# Patient Record
Sex: Female | Born: 1960 | Race: White | Hispanic: No | Marital: Married | Smoking: Never smoker
Health system: Southern US, Community
[De-identification: ages and names within clinical notes are randomized; demographics above are authoritative.]

## PROBLEM LIST (undated history)

## (undated) DIAGNOSIS — G43909 Migraine, unspecified, not intractable, without status migrainosus: Secondary | ICD-10-CM

## (undated) HISTORY — PX: TONSILLECTOMY: SHX5217

## (undated) HISTORY — PX: FOOT SURGERY: SHX648

## (undated) HISTORY — DX: Migraine, unspecified, not intractable, without status migrainosus: G43.909

---

## 1988-09-27 HISTORY — PX: CHOLECYSTECTOMY: SHX55

## 2010-09-10 ENCOUNTER — Ambulatory Visit: Payer: Self-pay | Admitting: Family Medicine

## 2010-10-01 ENCOUNTER — Ambulatory Visit: Payer: Self-pay | Admitting: Family Medicine

## 2011-05-29 LAB — HM COLONOSCOPY

## 2012-05-28 HISTORY — PX: COLONOSCOPY: SHX5424

## 2012-08-21 LAB — HM PAP SMEAR: HM Pap smear: NORMAL

## 2012-10-05 ENCOUNTER — Ambulatory Visit (INDEPENDENT_AMBULATORY_CARE_PROVIDER_SITE_OTHER): Payer: BC Managed Care – PPO | Admitting: Otolaryngology

## 2012-10-05 ENCOUNTER — Other Ambulatory Visit (HOSPITAL_COMMUNITY)
Admission: RE | Admit: 2012-10-05 | Discharge: 2012-10-05 | Disposition: A | Discharge status: Home / Self Care | Payer: BC Managed Care – PPO | Source: Ambulatory Visit | Attending: Otolaryngology | Admitting: Otolaryngology

## 2012-10-05 DIAGNOSIS — D3701 Neoplasm of uncertain behavior of lip: Secondary | ICD-10-CM

## 2012-10-05 DIAGNOSIS — K099 Cyst of oral region, unspecified: Secondary | ICD-10-CM | POA: Insufficient documentation

## 2015-07-16 ENCOUNTER — Ambulatory Visit (INDEPENDENT_AMBULATORY_CARE_PROVIDER_SITE_OTHER): Payer: BLUE CROSS/BLUE SHIELD | Admitting: Family Medicine

## 2015-07-16 ENCOUNTER — Encounter: Payer: Self-pay | Admitting: Family Medicine

## 2015-07-16 VITALS — BP 100/58 | HR 91 | Temp 98.6°F | Resp 14 | Ht 68.0 in | Wt 132.2 lb

## 2015-07-16 DIAGNOSIS — G43909 Migraine, unspecified, not intractable, without status migrainosus: Secondary | ICD-10-CM | POA: Insufficient documentation

## 2015-07-16 DIAGNOSIS — G43709 Chronic migraine without aura, not intractable, without status migrainosus: Secondary | ICD-10-CM

## 2015-07-16 MED ORDER — ZOLMITRIPTAN 5 MG PO TABS: 5.0000 mg | ORAL_TABLET | ORAL | Status: DC | PRN

## 2015-07-16 NOTE — Progress Notes (Signed)
Name: Diana Stevens   MRN: [id number]    DOB: 1960-11-29   Date:07/16/2015       Progress Note  Subjective  Chief Complaint  Chief Complaint  Patient presents with  . Medication Refill    patient needs a refill of this medicaiton (Zolmitriptan 5mg ) and wants to know if she could get refills.    Migraine  This is a chronic problem. The problem has been unchanged. The pain is located in the frontal region. The pain radiates to the face. The quality of the pain is described as throbbing. The pain is at a severity of 7/10 (can be 10/10 when the headache comes on.). Pertinent negatives include no blurred vision, ear pain, nausea, photophobia, sinus pressure, tinnitus or vomiting. The symptoms are aggravated by emotional stress. She has tried triptans for the symptoms. Her past medical history is significant for migraine headaches.   Past Medical History  Diagnosis Date  . Migraines     Past Surgical History  Procedure Laterality Date  . Cholecystectomy  1990  . Foot surgery Left     twice  . Tonsillectomy    . Colonoscopy  05/28/2012    normal, repeat in 10 yrs    Family History  Problem Relation Age of Onset  . Adopted: Yes    Social History   Social History  . Marital Status: Married    Spouse Name: N/A  . Number of Children: N/A  . Years of Education: N/A   Occupational History  . Not on file.   Social History Main Topics  . Smoking status: Former Research scientist (life sciences)  . Smokeless tobacco: Not on file     Comment: patient was a social smoker, very light use (1pk a month)  . Alcohol Use: 0.0 oz/week    0 Standard drinks or equivalent per week     Comment: occasional  . Drug Use: No  . Sexual Activity:    Partners: Male   Other Topics Concern  . Not on file   Social History Narrative  . No narrative on file     Current outpatient prescriptions:  .  CALCIUM CARBONATE-VIT D-MIN PO, Take by mouth., Disp: , Rfl:  .  diphenhydrAMINE (SOMINEX) 25 MG tablet, Take 25 mg by mouth  at bedtime as needed for sleep., Disp: , Rfl:  .  Multiple Vitamins-Minerals (MULTIPLE VITAMINS/WOMENS PO), Take by mouth., Disp: , Rfl:  .  pyridOXINE (VITAMIN B-6) 25 MG tablet, Take by mouth., Disp: , Rfl:  .  zolmitriptan (ZOMIG) 5 MG tablet, Take by mouth., Disp: , Rfl:   Allergies  Allergen Reactions  . Erythromycin    Review of Systems  HENT: Negative for ear pain, sinus pressure and tinnitus.   Eyes: Negative for blurred vision, double vision and photophobia.  Gastrointestinal: Negative for nausea and vomiting.  Neurological: Positive for headaches.   Objective  Filed Vitals:   07/16/15 1425  BP: 100/58  Pulse: 91  Temp: 98.6 F (37 C)  TempSrc: Oral  Resp: 14  Height: 5\' 8"  (1.727 m)  Weight: 132 lb 3.2 oz (59.966 kg)  SpO2: 98%    Physical Exam  Constitutional: She is oriented to person, place, and time and well-developed, well-nourished, and in no distress.  HENT:  Head: Normocephalic and atraumatic.  Eyes: Pupils are equal, round, and reactive to light.  Cardiovascular: Normal rate and regular rhythm.   Pulmonary/Chest: Effort normal and breath sounds normal.  Neurological: She is alert and oriented to person, place, and  time.  Nursing note and vitals reviewed.   Assessment & Plan  1. Chronic migraine without aura without status migrainosus, not intractable  - zolmitriptan (ZOMIG) 5 MG tablet; Take 1 tablet (5 mg total) by mouth as needed for migraine. May repeat X 1 after 2 hrs. Max: 10 mg/24hrs.  Dispense: 10 tablet; Refill: 2   Laquandra Carrillo [facility] 07/16/2015 2:53 PM

## 2015-10-02 ENCOUNTER — Telehealth: Payer: Self-pay

## 2015-10-02 NOTE — Telephone Encounter (Signed)
Patient declined influenza vaccination.

## 2015-10-30 ENCOUNTER — Encounter: Payer: Self-pay | Admitting: Family Medicine

## 2015-10-30 ENCOUNTER — Ambulatory Visit (INDEPENDENT_AMBULATORY_CARE_PROVIDER_SITE_OTHER): Payer: BLUE CROSS/BLUE SHIELD | Admitting: Family Medicine

## 2015-10-30 VITALS — BP 118/62 | HR 110 | Temp 98.2°F | Resp 16 | Ht 68.0 in | Wt 132.0 lb

## 2015-10-30 DIAGNOSIS — B029 Zoster without complications: Secondary | ICD-10-CM | POA: Diagnosis not present

## 2015-10-30 DIAGNOSIS — Z23 Encounter for immunization: Secondary | ICD-10-CM

## 2015-10-30 DIAGNOSIS — Z1239 Encounter for other screening for malignant neoplasm of breast: Secondary | ICD-10-CM | POA: Diagnosis not present

## 2015-10-30 MED ORDER — NORTRIPTYLINE HCL 10 MG PO CAPS: 10.0000 mg | ORAL_CAPSULE | Freq: Every day | ORAL | Status: DC

## 2015-10-30 MED ORDER — PREDNISONE 10 MG (48) PO TBPK: ORAL_TABLET | Freq: Every day | ORAL | Status: DC

## 2015-10-30 MED ORDER — HYDROCODONE-ACETAMINOPHEN 10-325 MG PO TABS: 1.0000 | ORAL_TABLET | Freq: Four times a day (QID) | ORAL | Status: DC | PRN

## 2015-10-30 MED ORDER — VALACYCLOVIR HCL 1 G PO TABS: 1000.0000 mg | ORAL_TABLET | Freq: Three times a day (TID) | ORAL | Status: DC

## 2015-10-30 NOTE — Patient Instructions (Signed)

## 2015-10-30 NOTE — Progress Notes (Signed)
Name: Diana Stevens   MRN: [id number]    DOB: 12-29-1960   Date:10/30/2015       Progress Note  Subjective  Chief Complaint  Chief Complaint  Patient presents with  . Rash    Onset-last Thursday, down the center of the buttock and painful-red sore and bumpy, Oatmeal baths and Aloe-no relief     HPI  Shingles: she developed pain on left buttocks last Thursday, but 3 days later she developed a erythematous rash, with some vesicles, very painful, causing discomfort with bowel movement, and hypersensitivity on left upper abdomen and sometimes left leg. No urinary retention. She is married for 20 years, only one sexual partner. She has been feeling tired and difficulty sleeping secondary to pain.   Patient Active Problem List   Diagnosis Date Noted  . Headache, migraine 07/16/2015  . Gravida 3 para 3 07/16/2015    Past Surgical History  Procedure Laterality Date  . Cholecystectomy  1990  . Foot surgery Left     twice  . Tonsillectomy    . Colonoscopy  05/28/2012    normal, repeat in 10 yrs    Family History  Problem Relation Age of Onset  . Adopted: Yes    Social History   Social History  . Marital Status: Married    Spouse Name: N/A  . Number of Children: N/A  . Years of Education: N/A   Occupational History  . Not on file.   Social History Main Topics  . Smoking status: Former Research scientist (life sciences)  . Smokeless tobacco: Not on file     Comment: patient was a social smoker, very light use (1pk a month)  . Alcohol Use: 0.0 oz/week    0 Standard drinks or equivalent per week     Comment: occasional  . Drug Use: No  . Sexual Activity:    Partners: Male   Other Topics Concern  . Not on file   Social History Narrative     Current outpatient prescriptions:  .  CALCIUM CARBONATE-VIT D-MIN PO, Take by mouth., Disp: , Rfl:  .  diphenhydrAMINE (SOMINEX) 25 MG tablet, Take 25 mg by mouth at bedtime as needed for itching or allergies. Reported on 10/30/2015, Disp: , Rfl:  .  Multiple  Vitamins-Minerals (MULTIPLE VITAMINS/WOMENS PO), Take by mouth., Disp: , Rfl:  .  pyridOXINE (VITAMIN B-6) 25 MG tablet, Take by mouth., Disp: , Rfl:  .  zolmitriptan (ZOMIG) 5 MG tablet, Take 1 tablet (5 mg total) by mouth as needed for migraine. May repeat X 1 after 2 hrs. Max: 10 mg/24hrs., Disp: 10 tablet, Rfl: 2 .  HYDROcodone-acetaminophen (NORCO) 10-325 MG tablet, Take 1 tablet by mouth every 6 (six) hours as needed for moderate pain., Disp: 30 tablet, Rfl: 0 .  nortriptyline (PAMELOR) 10 MG capsule, Take 1 capsule (10 mg total) by mouth at bedtime., Disp: 30 capsule, Rfl: 0 .  predniSONE (STERAPRED UNI-PAK 48 TAB) 10 MG (48) TBPK tablet, Take by mouth daily., Disp: 48 tablet, Rfl: 0 .  valACYclovir (VALTREX) 1000 MG tablet, Take 1 tablet (1,000 mg total) by mouth 3 (three) times daily., Disp: 30 tablet, Rfl: 0  Allergies  Allergen Reactions  . Erythromycin      ROS  Ten systems reviewed and is negative except as mentioned in HPI   Objective  Filed Vitals:   10/30/15 0946  BP: 118/62  Pulse: 110  Temp: 98.2 F (36.8 C)  TempSrc: Oral  Resp: 16  Height: 5\' 8"  (1.727 m)  Weight: 132 lb (59.875 kg)  SpO2: 98%    Body mass index is 20.08 kg/(m^2).  Physical Exam  Constitutional: Patient appears well-developed and well-nourished.  No distress.  HEENT: head atraumatic, normocephalic, pupils equal and reactive to light, neck supple, throat within normal limits Cardiovascular: Normal rate, regular rhythm and normal heart sounds.  No murmur heard. No BLE edema. Pulmonary/Chest: Effort normal and breath sounds normal. No respiratory distress. Abdominal: Soft.  There is no tenderness. Psychiatric: Patient has a normal mood and affect. behavior is normal. Judgment and thought content normal. Skin: erythematous macules with vesicles also some crusting areas, on left buttocks and left side of vulva, some going down left leg. Tender to touch.   PHQ2/9: Depression screen [facility] 2/9  10/30/2015 07/16/2015  Decreased Interest 0 0  Down, Depressed, Hopeless 0 0  PHQ - 2 Score 0 0     Fall Risk: Fall Risk  10/30/2015 07/16/2015  Falls in the past year? No No     Functional Status Survey: Is the patient deaf or have difficulty hearing?: No Does the patient have difficulty seeing, even when wearing glasses/contacts?: Yes (reading glasses) Does the patient have difficulty concentrating, remembering, or making decisions?: No Does the patient have difficulty walking or climbing stairs?: No Does the patient have difficulty dressing or bathing?: No Does the patient have difficulty doing errands alone such as visiting a doctor's office or shopping?: No    Assessment & Plan  1. Shingles  - Varicella-zoster by PCR - valACYclovir (VALTREX) 1000 MG tablet; Take 1 tablet (1,000 mg total) by mouth 3 (three) times daily.  Dispense: 30 tablet; Refill: 0 - predniSONE (STERAPRED UNI-PAK 48 TAB) 10 MG (48) TBPK tablet; Take by mouth daily.  Dispense: 48 tablet; Refill: 0 - HYDROcodone-acetaminophen (NORCO) 10-325 MG tablet; Take 1 tablet by mouth every 6 (six) hours as needed for moderate pain.  Dispense: 30 tablet; Refill: 0 - nortriptyline (PAMELOR) 10 MG capsule; Take 1 capsule (10 mg total) by mouth at bedtime.  Dispense: 30 capsule; Refill: 0  2. Need for Tdap vaccination  - Tdap vaccine greater than or equal to 7yo IM  3. Breast cancer screening  - MM Digital Screening; Future

## 2015-10-31 ENCOUNTER — Telehealth: Payer: Self-pay

## 2015-10-31 NOTE — Telephone Encounter (Signed)
Patient was encouraged to give the pharmacy a call back to make sure they did not have the rx that was sent electronically on 10/30/15 at 10:43am.

## 2015-11-03 LAB — VARICELLA-ZOSTER BY PCR: VARICELLA-ZOSTER, PCR: POSITIVE — AB

## 2015-11-18 ENCOUNTER — Encounter: Payer: Self-pay | Admitting: Family Medicine

## 2016-01-02 ENCOUNTER — Ambulatory Visit
Admission: RE | Admit: 2016-01-02 | Discharge: 2016-01-02 | Disposition: A | Discharge status: Home / Self Care | Payer: BLUE CROSS/BLUE SHIELD | Source: Ambulatory Visit | Attending: Family Medicine | Admitting: Family Medicine

## 2016-01-02 DIAGNOSIS — Z1231 Encounter for screening mammogram for malignant neoplasm of breast: Secondary | ICD-10-CM | POA: Diagnosis not present

## 2016-01-02 DIAGNOSIS — Z1239 Encounter for other screening for malignant neoplasm of breast: Secondary | ICD-10-CM

## 2016-01-06 ENCOUNTER — Other Ambulatory Visit: Payer: Self-pay | Admitting: Family Medicine

## 2016-01-06 DIAGNOSIS — R928 Other abnormal and inconclusive findings on diagnostic imaging of breast: Secondary | ICD-10-CM

## 2016-01-16 ENCOUNTER — Ambulatory Visit
Admission: RE | Admit: 2016-01-16 | Discharge: 2016-01-16 | Disposition: A | Discharge status: Home / Self Care | Payer: BLUE CROSS/BLUE SHIELD | Source: Ambulatory Visit | Attending: Family Medicine | Admitting: Family Medicine

## 2016-01-16 DIAGNOSIS — R928 Other abnormal and inconclusive findings on diagnostic imaging of breast: Secondary | ICD-10-CM

## 2016-09-09 ENCOUNTER — Other Ambulatory Visit: Payer: Self-pay | Admitting: Family Medicine

## 2016-09-09 DIAGNOSIS — G43709 Chronic migraine without aura, not intractable, without status migrainosus: Secondary | ICD-10-CM

## 2016-09-15 ENCOUNTER — Other Ambulatory Visit: Payer: Self-pay | Admitting: Family Medicine

## 2016-09-15 DIAGNOSIS — G43709 Chronic migraine without aura, not intractable, without status migrainosus: Secondary | ICD-10-CM

## 2016-10-04 ENCOUNTER — Ambulatory Visit (INDEPENDENT_AMBULATORY_CARE_PROVIDER_SITE_OTHER): Payer: BLUE CROSS/BLUE SHIELD | Admitting: Family Medicine

## 2016-10-04 ENCOUNTER — Encounter: Payer: Self-pay | Admitting: Family Medicine

## 2016-10-04 ENCOUNTER — Telehealth: Payer: Self-pay | Admitting: Family Medicine

## 2016-10-04 DIAGNOSIS — G43709 Chronic migraine without aura, not intractable, without status migrainosus: Secondary | ICD-10-CM

## 2016-10-04 MED ORDER — ZOLMITRIPTAN 5 MG PO TABS: 5.0000 mg | ORAL_TABLET | ORAL | 2 refills | Status: DC | PRN

## 2016-10-04 NOTE — Progress Notes (Signed)
Name: Diana Stevens   MRN: [id number]    DOB: 10/13/1960   Date:10/04/2016       Progress Note  Subjective  Chief Complaint  Chief Complaint  Patient presents with  . Medication Refill    migraine medication  . Flu Vaccine    pt refused    Migraine   This is a chronic problem. The problem has been unchanged. The pain is located in the frontal region. The pain radiates to the face. The quality of the pain is described as pulsating. The pain is at a severity of 7/10 (can be 9/10 when the headache comes on.). Associated symptoms include nausea. Pertinent negatives include no back pain, blurred vision, ear pain, phonophobia, photophobia, sinus pressure, sore throat, tinnitus or visual change. The symptoms are aggravated by emotional stress. She has tried triptans for the symptoms. Her past medical history is significant for migraine headaches.    Past Medical History:  Diagnosis Date  . Migraines     Past Surgical History:  Procedure Laterality Date  . CHOLECYSTECTOMY  1990  . COLONOSCOPY  05/28/2012   normal, repeat in 10 yrs  . FOOT SURGERY Left    twice  . TONSILLECTOMY      Family History  Problem Relation Age of Onset  . Adopted: Yes  . Breast cancer Neg Hx     Social History   Social History  . Marital status: Married    Spouse name: N/A  . Number of children: N/A  . Years of education: N/A   Occupational History  . Not on file.   Social History Main Topics  . Smoking status: Former Research scientist (life sciences)  . Smokeless tobacco: Not on file     Comment: patient was a social smoker, very light use (1pk a month)  . Alcohol use 0.0 oz/week     Comment: occasional  . Drug use: No  . Sexual activity: Yes    Partners: Male   Other Topics Concern  . Not on file   Social History Narrative  . No narrative on file     Current Outpatient Prescriptions:  .  CALCIUM CARBONATE-VIT D-MIN PO, Take by mouth., Disp: , Rfl:  .  diphenhydrAMINE (SOMINEX) 25 MG tablet, Take 25 mg by mouth  at bedtime as needed for itching or allergies. Reported on 10/30/2015, Disp: , Rfl:  .  HYDROcodone-acetaminophen (NORCO) 10-325 MG tablet, Take 1 tablet by mouth every 6 (six) hours as needed for moderate pain., Disp: 30 tablet, Rfl: 0 .  Multiple Vitamins-Minerals (MULTIPLE VITAMINS/WOMENS PO), Take by mouth., Disp: , Rfl:  .  nortriptyline (PAMELOR) 10 MG capsule, Take 1 capsule (10 mg total) by mouth at bedtime., Disp: 30 capsule, Rfl: 0 .  predniSONE (STERAPRED UNI-PAK 48 TAB) 10 MG (48) TBPK tablet, Take by mouth daily., Disp: 48 tablet, Rfl: 0 .  pyridOXINE (VITAMIN B-6) 25 MG tablet, Take by mouth., Disp: , Rfl:  .  valACYclovir (VALTREX) 1000 MG tablet, Take 1 tablet (1,000 mg total) by mouth 3 (three) times daily., Disp: 30 tablet, Rfl: 0 .  zolmitriptan (ZOMIG) 5 MG tablet, Take 1 tablet (5 mg total) by mouth as needed for migraine. May repeat X 1 after 2 hrs. Max: 10 mg/24hrs., Disp: 10 tablet, Rfl: 2  Allergies  Allergen Reactions  . Erythromycin      Review of Systems  HENT: Negative for ear pain, sinus pressure, sore throat and tinnitus.   Eyes: Negative for blurred vision and photophobia.  Gastrointestinal: Positive  for nausea.  Musculoskeletal: Negative for back pain.    Objective  Vitals:   10/04/16 1523  BP: 110/68  Pulse: 85  Resp: 16  Temp: 98.1 F (36.7 C)  SpO2: 96%  Weight: 135 lb 6 oz (61.4 kg)  Height: 5\' 8"  (1.727 m)    Physical Exam  Constitutional: She is oriented to person, place, and time and well-developed, well-nourished, and in no distress.  HENT:  Head: Normocephalic and atraumatic.  Eyes: Pupils are equal, round, and reactive to light.  Cardiovascular: Normal rate and regular rhythm.   Pulmonary/Chest: Effort normal and breath sounds normal.  Neurological: She is alert and oriented to person, place, and time.  Nursing note and vitals reviewed.      Assessment & Plan  1. Chronic migraine without aura without status migrainosus, not  intractable Stable and responsive to Zomig as prescribed, refills provided - zolmitriptan (ZOMIG) 5 MG tablet; Take 1 tablet (5 mg total) by mouth as needed for migraine. May repeat X 1 after 2 hrs. Max: 10 mg/24hrs.  Dispense: 10 tablet; Refill: 2   Diana Stevens [facility] 10/04/2016 3:41 PM

## 2016-10-04 NOTE — Telephone Encounter (Signed)
PT SAID THAT SHE IS NEEDING REFILLS ON HER ZOLMITRIPTAN. SAYS THAT SEVERAL REQUEST HAVE BEEN SENT IN WITH NO RESPONSE. Kirk

## 2016-10-05 NOTE — Telephone Encounter (Signed)
Medication was refilled on 10/04/2016 and sent to [address]

## 2016-10-17 IMAGING — MG MM DIGITAL DIAGNOSTIC UNILAT*R* W/ TOMO W/ CAD
6 series · 6 of 14 positions shown · non-contrast
Comparison: Previous exam(s).

CLINICAL DATA: Patient returns today to evaluate a possible right
breast asymmetry questioned on recent screening mammogram.

EXAM:
2D DIGITAL DIAGNOSTIC UNILATERAL RIGHT MAMMOGRAM WITH CAD AND
ADJUNCT TOMO

[R MLO]
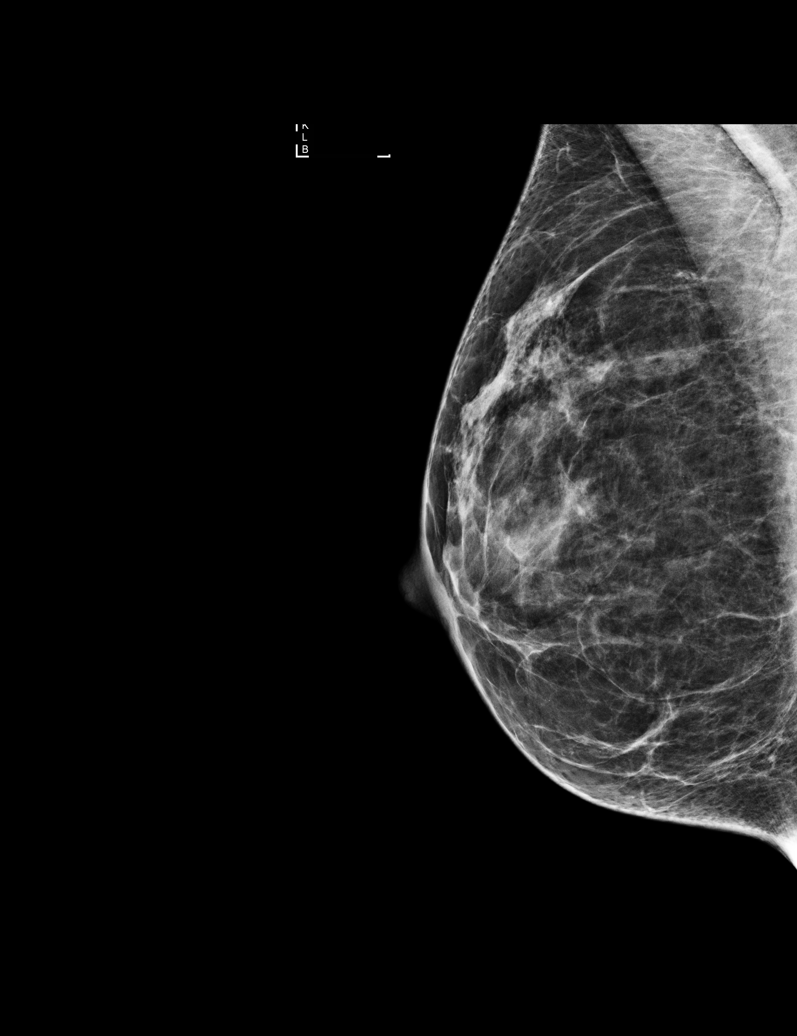

[R MLO synth-2D]
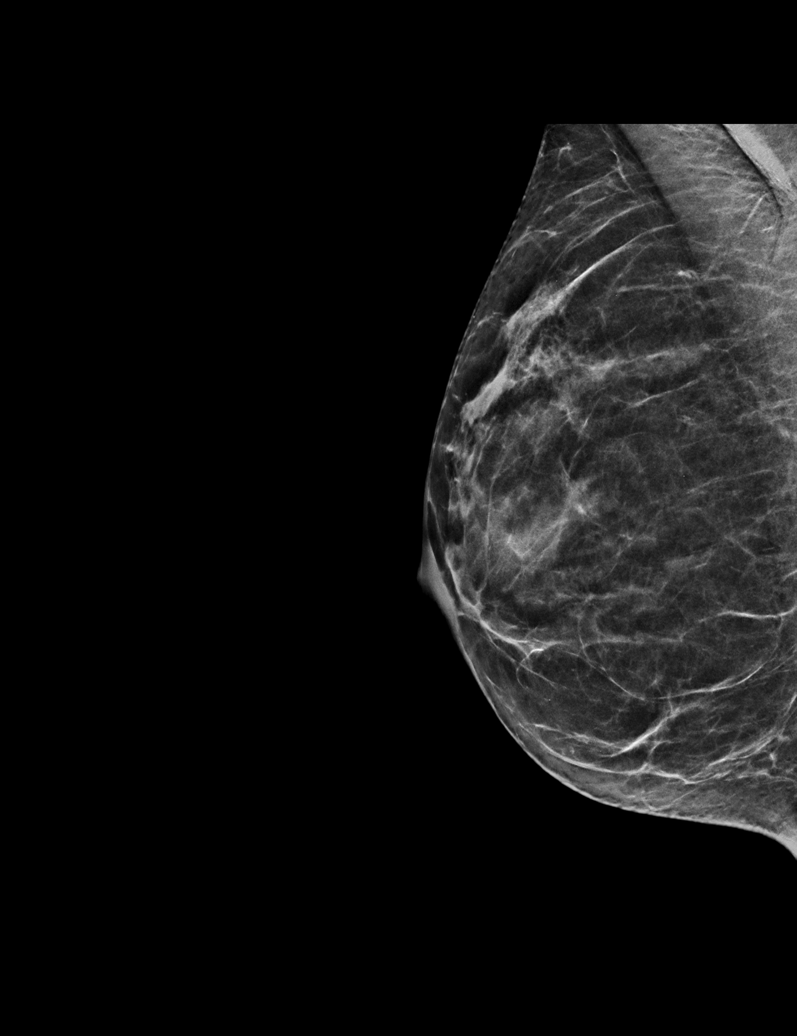

[R CC]
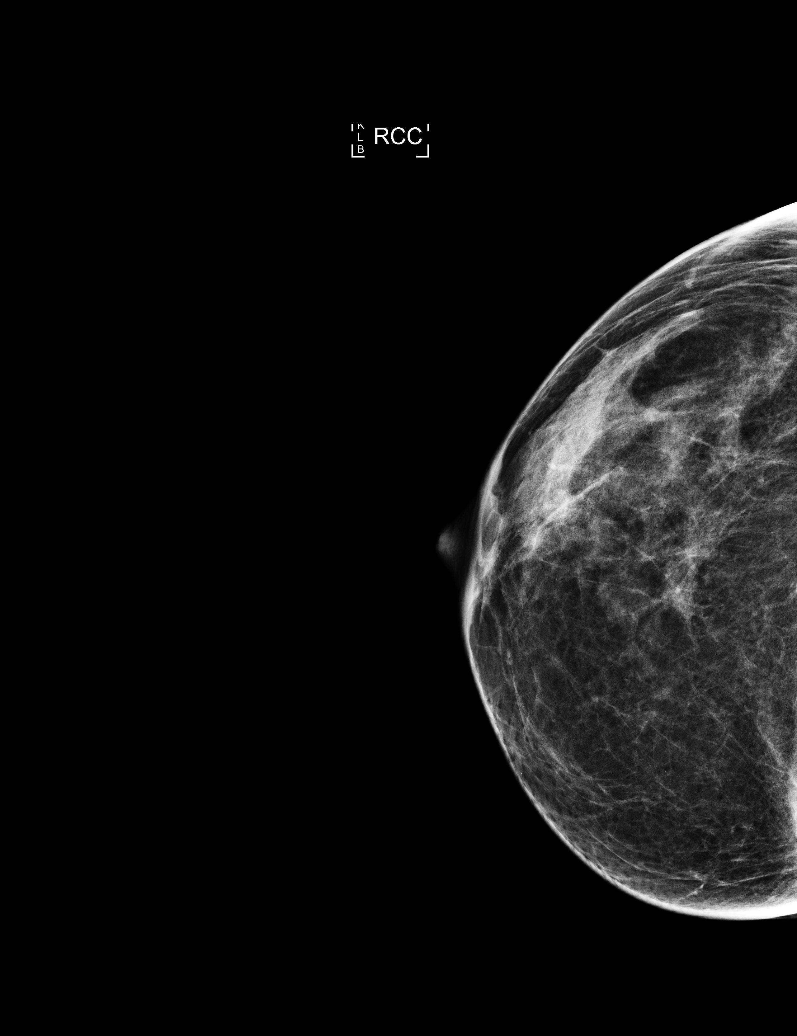

[R CC synth-2D]
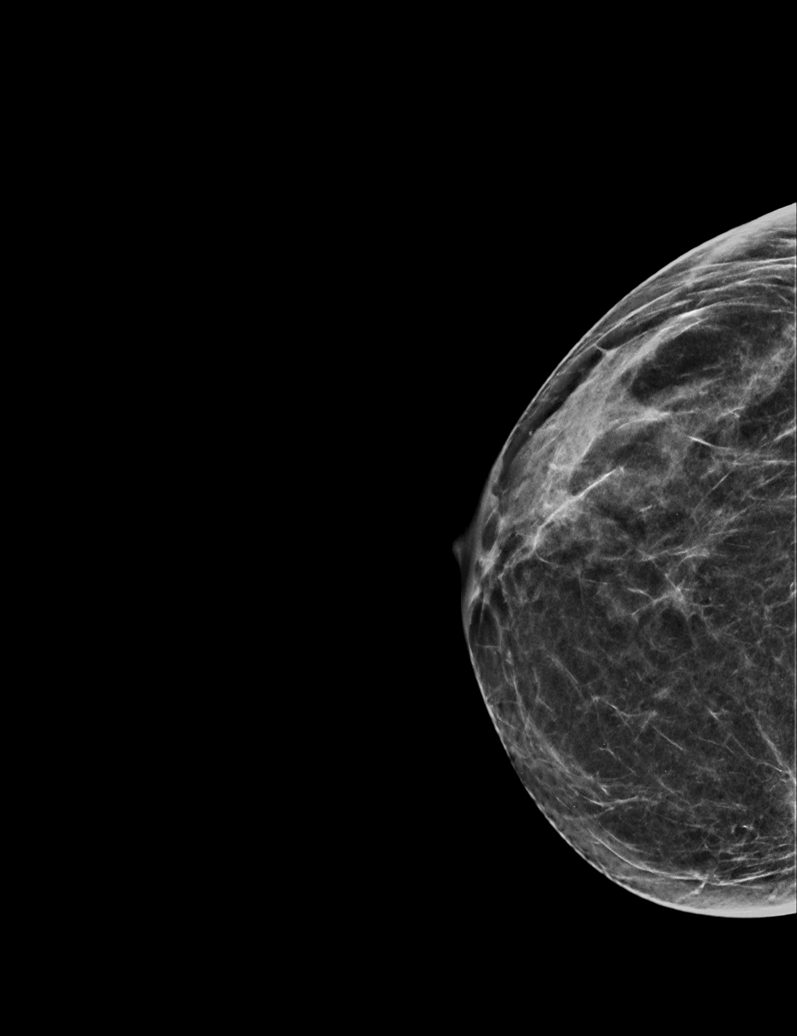

[R MLO tomo · tomo slice 26/51.0]
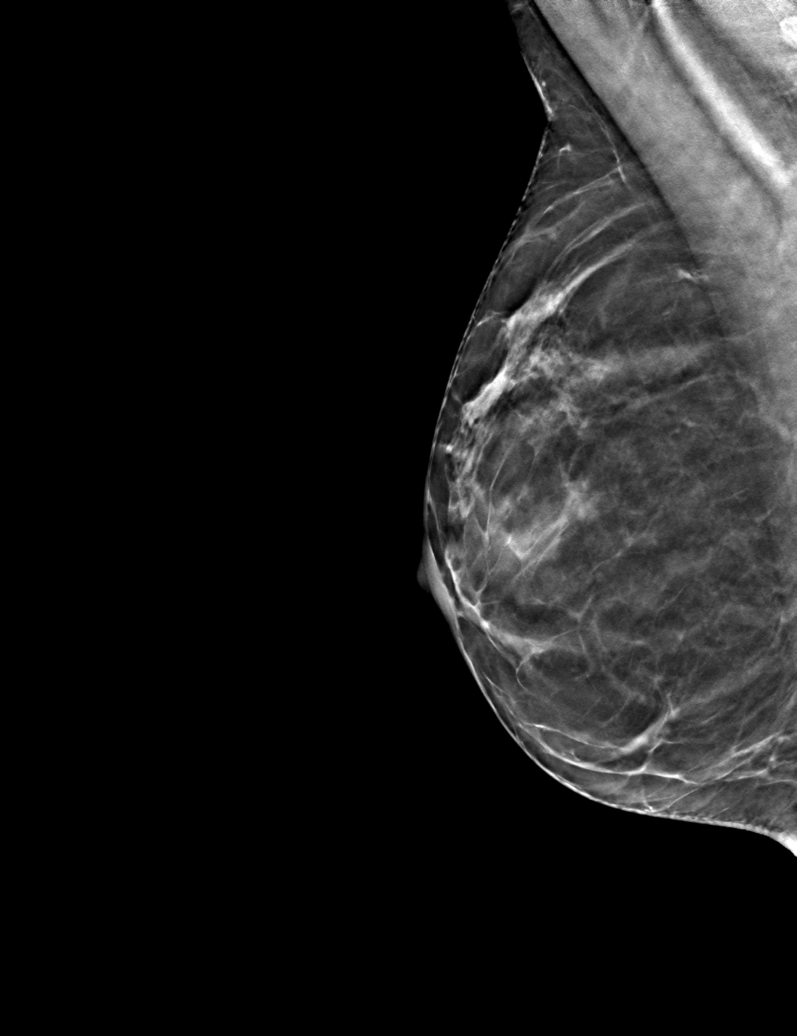

[R CC tomo · tomo slice 27/52.0]
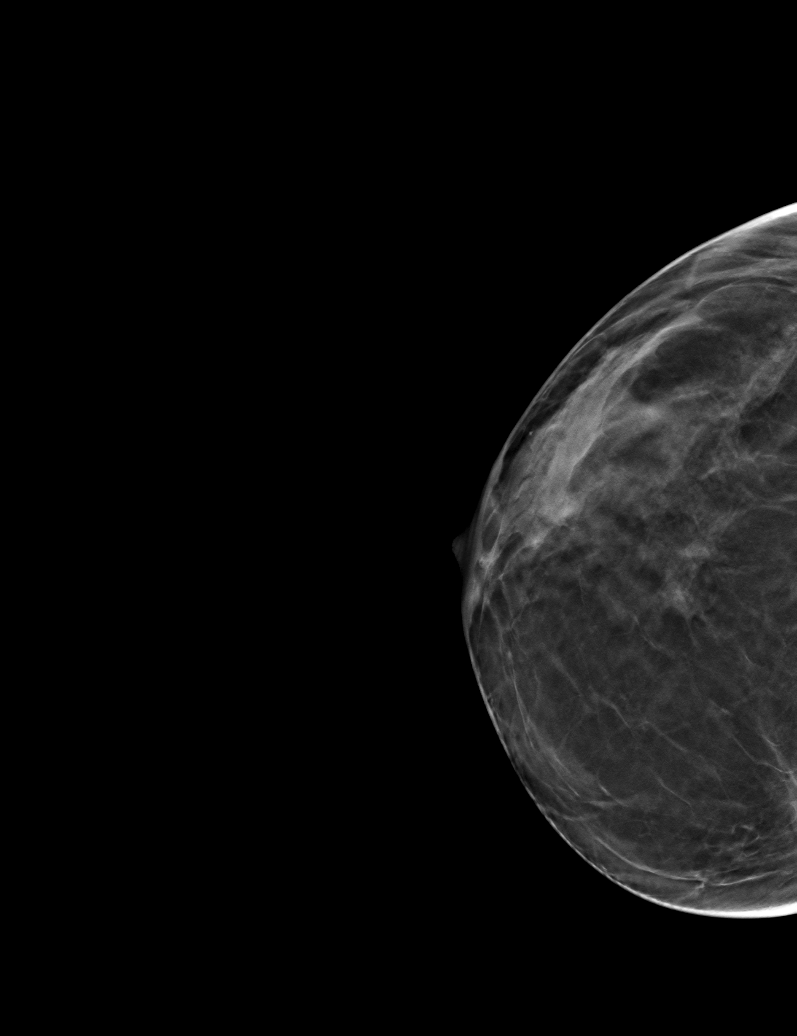

[6 of 14 positions shown; findings below may reference images not displayed]

ACR Breast Density Category c: The breast tissue is heterogeneously
dense, which may obscure small masses.
FINDINGS: On today's additional views of the right breast with 3D
tomosynthesis, there is no persistent asymmetry within the right
breast indicating superimposition of normal fibroglandular tissues.
There are no dominant masses, suspicious calcifications or secondary
signs of malignancy identified within the right breast.

Mammographic images were processed with CAD.
IMPRESSION: No evidence of malignancy within the right breast.

Patient may return to routine annual bilateral screening mammogram
schedule.

RECOMMENDATION:
Screening mammogram in one year.(Code:BH-C-XML)

I have discussed the findings and recommendations with the patient.
Results were also provided in writing at the conclusion of the
visit. If applicable, a reminder letter will be sent to the patient
regarding the next appointment.

BI-RADS CATEGORY  1: Negative.

## 2017-10-04 ENCOUNTER — Ambulatory Visit: Payer: BLUE CROSS/BLUE SHIELD | Admitting: Family Medicine

## 2018-03-10 LAB — BASIC METABOLIC PANEL: GLUCOSE: 92

## 2018-03-10 LAB — LIPID PANEL
Cholesterol: 179 (ref 0–200)
HDL: 73 — AB (ref 35–70)
LDL Cholesterol: 89
Triglycerides: 84 (ref 40–160)

## 2018-05-08 ENCOUNTER — Ambulatory Visit (INDEPENDENT_AMBULATORY_CARE_PROVIDER_SITE_OTHER): Payer: 59 | Admitting: Family Medicine

## 2018-05-08 ENCOUNTER — Encounter: Payer: Self-pay | Admitting: Family Medicine

## 2018-05-08 VITALS — BP 108/68 | HR 105 | Temp 97.9°F | Resp 16 | Ht 68.0 in | Wt 134.2 lb

## 2018-05-08 DIAGNOSIS — Z1231 Encounter for screening mammogram for malignant neoplasm of breast: Secondary | ICD-10-CM

## 2018-05-08 DIAGNOSIS — M67431 Ganglion, right wrist: Secondary | ICD-10-CM | POA: Insufficient documentation

## 2018-05-08 DIAGNOSIS — Z1159 Encounter for screening for other viral diseases: Secondary | ICD-10-CM | POA: Diagnosis not present

## 2018-05-08 DIAGNOSIS — G43009 Migraine without aura, not intractable, without status migrainosus: Secondary | ICD-10-CM

## 2018-05-08 DIAGNOSIS — Z1239 Encounter for other screening for malignant neoplasm of breast: Secondary | ICD-10-CM

## 2018-05-08 DIAGNOSIS — L508 Other urticaria: Secondary | ICD-10-CM

## 2018-05-08 MED ORDER — ZOLMITRIPTAN 5 MG PO TABS: 5.0000 mg | ORAL_TABLET | ORAL | 2 refills | Status: DC | PRN

## 2018-05-08 MED ORDER — DIPHENHYDRAMINE HCL 25 MG PO CAPS: 25.0000 mg | ORAL_CAPSULE | Freq: Four times a day (QID) | ORAL | 0 refills | Status: AC | PRN

## 2018-05-08 NOTE — Progress Notes (Signed)
Name: Diana Stevens   MRN: [id number]    DOB: 07-28-1961   Date:05/08/2018       Progress Note  Subjective  Chief Complaint  Chief Complaint  Patient presents with  . Medication Refill  . Migraine    Has one episode about every 2 months-Triggers are stress, laying out in the heat for prolong periods of time. Sometimes has to take two Zomig to make her migraine go away.  . Wrist Problem    Onset-6 months, left wrist- painful with pressure, and when putting her palms down.     HPI  Migraine headaches: she has a long history of migraines . Started in her early 20's, migraine is described as frontal headache that radiates to nuchal area or radiates to her teeth. Pain is described as tightness and aching. She denies photophobia, but has phonophobia. Sometimes associated with nausea but no vomiting. Tried multiple medications and only responds to Zomig ( failed Imitrex, Excedrin migraine, tylenol, Aleve) She states she has on average one episode per month. Once she takes Zomig symptoms resolves within one hour.   Wrist problem: she states that she noticed a knot on radial wrist about 6 months ago, painful only with pressure. No redness or increase in warmth, no trauma.   History of shingles: discussed singles vaccine and we can wait until 2022 to have vaccination, but she can have it sooner if desired.   Preventive care: discussed importance of regular follow ups and mammograms, pap smear and labs.    Patient Active Problem List   Diagnosis Date Noted  . Headache, migraine 07/16/2015    Past Surgical History:  Procedure Laterality Date  . CHOLECYSTECTOMY  1990  . COLONOSCOPY  05/28/2012   normal, repeat in 10 yrs  . FOOT SURGERY Left    twice  . TONSILLECTOMY      Family History  Adopted: Yes  Problem Relation Age of Onset  . Breast cancer Neg Hx     Social History   Socioeconomic History  . Marital status: Married    Spouse name: Ulice Dash   . Number of children: 3  . Years of  education: Not on file  . Highest education level: Associate degree: academic program  Occupational History  . Occupation: Risk manager   Social Needs  . Financial resource strain: Not on file  . Food insecurity:    Worry: Not on file    Inability: Not on file  . Transportation needs:    Medical: Not on file    Non-medical: Not on file  Tobacco Use  . Smoking status: Former Research scientist (life sciences)  . Tobacco comment: patient was a social smoker, very light use (1pk a month)  Substance and Sexual Activity  . Alcohol use: Yes    Alcohol/week: 0.0 standard drinks    Comment: occasional  . Drug use: No  . Sexual activity: Yes    Partners: Male  Lifestyle  . Physical activity:    Days per week: Not on file    Minutes per session: Not on file  . Stress: Not on file  Relationships  . Social connections:    Talks on phone: Not on file    Gets together: Not on file    Attends religious service: Not on file    Active member of club or organization: Not on file    Attends meetings of clubs or organizations: Not on file    Relationship status: Not on file  . Intimate partner violence:  Fear of current or ex partner: Not on file    Emotionally abused: Not on file    Physically abused: Not on file    Forced sexual activity: Not on file  Other Topics Concern  . Not on file  Social History Narrative   Married, adopted 3 daughters      Current Outpatient Medications:  .  CALCIUM CARBONATE-VIT D-MIN PO, Take by mouth., Disp: , Rfl:  .  Multiple Vitamins-Minerals (MULTIPLE VITAMINS/WOMENS PO), Take by mouth., Disp: , Rfl:  .  zolmitriptan (ZOMIG) 5 MG tablet, Take 1 tablet (5 mg total) by mouth as needed for migraine. May repeat X 1 after 2 hrs. Max: 10 mg/24hrs., Disp: 9 tablet, Rfl: 2 .  diphenhydrAMINE (BENADRYL ALLERGY) 25 mg capsule, Take 1 capsule (25 mg total) by mouth every 6 (six) hours as needed., Disp: 30 capsule, Rfl: 0  Allergies  Allergen Reactions  . Erythromycin       ROS  Constitutional: Negative for fever or weight change.  Respiratory: Negative for cough and shortness of breath.   Cardiovascular: Negative for chest pain or palpitations.  Gastrointestinal: Negative for abdominal pain, no bowel changes.  Musculoskeletal: Negative for gait problem or joint swelling.  Skin: Negative for rash.  Neurological: Negative for dizziness, positive for intermittent  headache.  No other specific complaints in a complete review of systems (except as listed in HPI above).  Objective  Vitals:   05/08/18 1417  BP: 108/68  Pulse: (!) 105  Resp: 16  Temp: 97.9 F (36.6 C)  TempSrc: Oral  SpO2: 96%  Weight: 134 lb 3.2 oz (60.9 kg)  Height: 5\' 8"  (1.727 m)    Body mass index is 20.41 kg/m.  Physical Exam  Constitutional: Patient appears well-developed and well-nourished.No distress.  HEENT: head atraumatic, normocephalic, pupils equal and reactive to light, neck supple, throat within normal limits Cardiovascular: Normal rate, regular rhythm and normal heart sounds.  No murmur heard. No BLE edema. Pulmonary/Chest: Effort normal and breath sounds normal. No respiratory distress. Abdominal: Soft.  There is no tenderness. Psychiatric: Patient has a normal mood and affect. behavior is normal. Judgment and thought content normal.  Recent Results (from the past 2160 hour(s))  Basic metabolic panel     Status: None   Collection Time: 03/10/18 12:00 AM  Result Value Ref Range   Glucose 92   Lipid panel     Status: Abnormal   Collection Time: 03/10/18 12:00 AM  Result Value Ref Range   Triglycerides 84 40 - 160   Cholesterol 179 0 - 200   HDL 73 (A) 35 - 70   LDL Cholesterol 89      PHQ2/9: Depression screen [facility] 2/9 05/08/2018 10/30/2015 07/16/2015  Decreased Interest 0 0 0  Down, Depressed, Hopeless 0 0 0  PHQ - 2 Score 0 0 0     Fall Risk: Fall Risk  05/08/2018 10/30/2015 07/16/2015  Falls in the past year? No No No    Functional Status  Survey: Is the patient deaf or have difficulty hearing?: No Does the patient have difficulty seeing, even when wearing glasses/contacts?: No Does the patient have difficulty concentrating, remembering, or making decisions?: No Does the patient have difficulty walking or climbing stairs?: No Does the patient have difficulty dressing or bathing?: No Does the patient have difficulty doing errands alone such as visiting a doctor's office or shopping?: No    Assessment & Plan  1. Migraine without aura and without status migrainosus, not intractable  -  zolmitriptan (ZOMIG) 5 MG tablet; Take 1 tablet (5 mg total) by mouth as needed for migraine. May repeat X 1 after 2 hrs. Max: 10 mg/24hrs.  Dispense: 9 tablet; Refill: 2  2. Hives, physical  - diphenhydrAMINE (BENADRYL ALLERGY) 25 mg capsule; Take 1 capsule (25 mg total) by mouth every 6 (six) hours as needed.  Dispense: 30 capsule; Refill: 0  3. Breast cancer screening  - MM DIGITAL SCREENING BILATERAL; Future  4. Need for hepatitis C screening test  - Hepatitis C Antibody  5. Ganglion cyst of wrist, right  She will call back when she decides to see Ortho

## 2018-05-09 LAB — HEPATITIS C ANTIBODY
HEP C AB: NONREACTIVE
SIGNAL TO CUT-OFF: 0.01 (ref <1.00)

## 2018-06-22 ENCOUNTER — Ambulatory Visit
Admission: RE | Admit: 2018-06-22 | Discharge: 2018-06-22 | Disposition: A | Discharge status: Home / Self Care | Payer: 59 | Source: Ambulatory Visit | Attending: Family Medicine | Admitting: Family Medicine

## 2018-06-22 DIAGNOSIS — Z1231 Encounter for screening mammogram for malignant neoplasm of breast: Secondary | ICD-10-CM | POA: Diagnosis not present

## 2018-06-22 DIAGNOSIS — Z1239 Encounter for other screening for malignant neoplasm of breast: Secondary | ICD-10-CM

## 2018-08-11 ENCOUNTER — Encounter: Payer: Self-pay | Admitting: Family Medicine

## 2018-08-11 ENCOUNTER — Ambulatory Visit (INDEPENDENT_AMBULATORY_CARE_PROVIDER_SITE_OTHER): Payer: 59 | Admitting: Family Medicine

## 2018-08-11 ENCOUNTER — Other Ambulatory Visit (HOSPITAL_COMMUNITY)
Admission: RE | Admit: 2018-08-11 | Discharge: 2018-08-11 | Disposition: A | Discharge status: Home / Self Care | Payer: 59 | Source: Ambulatory Visit | Attending: Family Medicine | Admitting: Family Medicine

## 2018-08-11 VITALS — BP 102/60 | HR 92 | Temp 97.8°F | Resp 16 | Ht 68.0 in | Wt 134.1 lb

## 2018-08-11 DIAGNOSIS — Z01419 Encounter for gynecological examination (general) (routine) without abnormal findings: Secondary | ICD-10-CM | POA: Diagnosis not present

## 2018-08-11 DIAGNOSIS — N952 Postmenopausal atrophic vaginitis: Secondary | ICD-10-CM

## 2018-08-11 DIAGNOSIS — Z124 Encounter for screening for malignant neoplasm of cervix: Secondary | ICD-10-CM | POA: Diagnosis present

## 2018-08-11 MED ORDER — ESTRADIOL 0.1 MG/GM VA CREA: 1.0000 | TOPICAL_CREAM | Freq: Every day | VAGINAL | 12 refills | Status: DC

## 2018-08-11 NOTE — Patient Instructions (Signed)
Check with insurance about shingrix vaccine coverage and also bone density test for post-menopausal  ( ovarian failure)   Preventive Care 40-64 Years, Female Preventive care refers to lifestyle choices and visits with your health care provider that can promote health and wellness. What does preventive care include?  A yearly physical exam. This is also called an annual well check.  Dental exams once or twice a year.  Routine eye exams. Ask your health care provider how often you should have your eyes checked.  Personal lifestyle choices, including: ? Daily care of your teeth and gums. ? Regular physical activity. ? Eating a healthy diet. ? Avoiding tobacco and drug use. ? Limiting alcohol use. ? Practicing safe sex. ? Taking low-dose aspirin daily starting at age 91. ? Taking vitamin and mineral supplements as recommended by your health care provider. What happens during an annual well check? The services and screenings done by your health care provider during your annual well check will depend on your age, overall health, lifestyle risk factors, and family history of disease. Counseling Your health care provider may ask you questions about your:  Alcohol use.  Tobacco use.  Drug use.  Emotional well-being.  Home and relationship well-being.  Sexual activity.  Eating habits.  Work and work Statistician.  Method of birth control.  Menstrual cycle.  Pregnancy history.  Screening You may have the following tests or measurements:  Height, weight, and BMI.  Blood pressure.  Lipid and cholesterol levels. These may be checked every 5 years, or more frequently if you are over 19 years old.  Skin check.  Lung cancer screening. You may have this screening every year starting at age 39 if you have a 30-pack-year history of smoking and currently smoke or have quit within the past 15 years.  Fecal occult blood test (FOBT) of the stool. You may have this test every year  starting at age 82.  Flexible sigmoidoscopy or colonoscopy. You may have a sigmoidoscopy every 5 years or a colonoscopy every 10 years starting at age 36.  Hepatitis C blood test.  Hepatitis B blood test.  Sexually transmitted disease (STD) testing.  Diabetes screening. This is done by checking your blood sugar (glucose) after you have not eaten for a while (fasting). You may have this done every 1-3 years.  Mammogram. This may be done every 1-2 years. Talk to your health care provider about when you should start having regular mammograms. This may depend on whether you have a family history of breast cancer.  BRCA-related cancer screening. This may be done if you have a family history of breast, ovarian, tubal, or peritoneal cancers.  Pelvic exam and Pap test. This may be done every 3 years starting at age 26. Starting at age 71, this may be done every 5 years if you have a Pap test in combination with an HPV test.  Bone density scan. This is done to screen for osteoporosis. You may have this scan if you are at high risk for osteoporosis.  Discuss your test results, treatment options, and if necessary, the need for more tests with your health care provider. Vaccines Your health care provider may recommend certain vaccines, such as:  Influenza vaccine. This is recommended every year.  Tetanus, diphtheria, and acellular pertussis (Tdap, Td) vaccine. You may need a Td booster every 10 years.  Varicella vaccine. You may need this if you have not been vaccinated.  Zoster vaccine. You may need this after age 64.  Measles, mumps, and rubella (MMR) vaccine. You may need at least one dose of MMR if you were born in 1957 or later. You may also need a second dose.  Pneumococcal 13-valent conjugate (PCV13) vaccine. You may need this if you have certain conditions and were not previously vaccinated.  Pneumococcal polysaccharide (PPSV23) vaccine. You may need one or two doses if you smoke  cigarettes or if you have certain conditions.  Meningococcal vaccine. You may need this if you have certain conditions.  Hepatitis A vaccine. You may need this if you have certain conditions or if you travel or work in places where you may be exposed to hepatitis A.  Hepatitis B vaccine. You may need this if you have certain conditions or if you travel or work in places where you may be exposed to hepatitis B.  Haemophilus influenzae type b (Hib) vaccine. You may need this if you have certain conditions.  Talk to your health care provider about which screenings and vaccines you need and how often you need them. This information is not intended to replace advice given to you by your health care provider. Make sure you discuss any questions you have with your health care provider. Document Released: 10/10/2015 Document Revised: 06/02/2016 Document Reviewed: 07/15/2015 Elsevier Interactive Patient Education  [employer].

## 2018-08-11 NOTE — Progress Notes (Signed)
Name: Diana Stevens   MRN: [id number]    DOB: 1961/07/05   Date:08/11/2018       Progress Note  Subjective  Chief Complaint  Chief Complaint  Patient presents with  . Annual Exam  . Immunizations    declines flu shot    HPI    Patient presents for annual CPE  Diet: she eats 3 meals daily, eats whole grains , fruits and vegetables daily       Office Visit from 08/11/2018 in [facility]  AUDIT-C Score  1     Depression:  Depression screen [facility] 2/9 08/11/2018 05/08/2018 10/30/2015 07/16/2015  Decreased Interest 0 0 0 0  Down, Depressed, Hopeless 0 0 0 0  PHQ - 2 Score 0 0 0 0  Altered sleeping 0 - - -  Tired, decreased energy 0 - - -  Change in appetite 0 - - -  Feeling bad or failure about yourself  0 - - -  Trouble concentrating 0 - - -  Moving slowly or fidgety/restless 0 - - -  Suicidal thoughts 0 - - -  PHQ-9 Score 0 - - -  Difficult doing work/chores Not difficult at all - - -   Hypertension: BP Readings from Last 3 Encounters:  08/11/18 102/60  05/08/18 108/68  10/04/16 110/68   Obesity: Wt Readings from Last 3 Encounters:  08/11/18 134 lb 1.6 oz (60.8 kg)  05/08/18 134 lb 3.2 oz (60.9 kg)  10/04/16 135 lb 6 oz (61.4 kg)   BMI Readings from Last 3 Encounters:  08/11/18 20.39 kg/m  05/08/18 20.41 kg/m  10/04/16 20.58 kg/m    Hep C Screening: up to date  STD testing and prevention (HIV/chl/gon/syphilis): N/A Intimate partner violence: negative  Sexual History/Pain during Intercourse: no pain  Menstrual History/LMP/Abnormal Bleeding: discussed post menopausal bleeding and menopausal symptoms  Incontinence Symptoms: none  Advanced Care Planning: A voluntary discussion about advance care planning including the explanation and discussion of advance directives.  Discussed health care proxy and Living will, and the patient was able to identify a health care proxy as husband.  Patient does not know have a living will at present  time.  Breast cancer:  BRCA gene screening: reviewed family history and not a candidate for genetic testing  Cervical cancer screening:  Today   Osteoporosis Screening: discussed bone density, but we will hold for now for testing,   Lipids:  Lab Results  Component Value Date   CHOL 179 03/10/2018   Lab Results  Component Value Date   HDL 73 (A) 03/10/2018   Lab Results  Component Value Date   LDLCALC 89 03/10/2018   Lab Results  Component Value Date   TRIG 84 03/10/2018   No results found for: CHOLHDL No results found for: LDLDIRECT  Glucose:  No results found for: GLUCOSE, GLUCAP  Skin cancer: she sees Dermatologist  Colorectal cancer: up to date Lung cancer:   Low Dose CT Chest recommended if Age 59-80 years, 30 pack-year currently smoking OR have quit w/in 15years. Patient does not qualify.   ECG: discussed may not be covered by insurance   Patient Active Problem List   Diagnosis Date Noted  . Ganglion cyst of wrist, right 05/08/2018  . Headache, migraine 07/16/2015    Past Surgical History:  Procedure Laterality Date  . CHOLECYSTECTOMY  1990  . COLONOSCOPY  05/28/2012   normal, repeat in 10 yrs  . FOOT SURGERY Left    twice  . TONSILLECTOMY  Family History  Adopted: Yes  Problem Relation Age of Onset  . Breast cancer Neg Hx     Social History   Socioeconomic History  . Marital status: Married    Spouse name: Ulice Dash   . Number of children: 3  . Years of education: Not on file  . Highest education level: Associate degree: academic program  Occupational History  . Occupation: Risk manager   Social Needs  . Financial resource strain: Not hard at all  . Food insecurity:    Worry: Never true    Inability: Never true  . Transportation needs:    Medical: No    Non-medical: No  Tobacco Use  . Smoking status: Never Smoker  . Smokeless tobacco: Never Used  . Tobacco comment: patient was a social smoker, very light use (1pk a month)   Substance and Sexual Activity  . Alcohol use: Yes    Alcohol/week: 0.0 standard drinks    Comment: occasional  . Drug use: No  . Sexual activity: Yes    Partners: Male  Lifestyle  . Physical activity:    Days per week: 5 days    Minutes per session: 60 min  . Stress: Not at all  Relationships  . Social connections:    Talks on phone: Once a week    Gets together: Once a week    Attends religious service: More than 4 times per year    Active member of club or organization: No    Attends meetings of clubs or organizations: Never    Relationship status: Married  . Intimate partner violence:    Fear of current or ex partner: No    Emotionally abused: No    Physically abused: No    Forced sexual activity: No  Other Topics Concern  . Not on file  Social History Narrative   Married, adopted 3 daughters      Current Outpatient Medications:  .  CALCIUM CARBONATE-VIT D-MIN PO, Take by mouth., Disp: , Rfl:  .  diphenhydrAMINE (BENADRYL ALLERGY) 25 mg capsule, Take 1 capsule (25 mg total) by mouth every 6 (six) hours as needed., Disp: 30 capsule, Rfl: 0 .  Multiple Vitamins-Minerals (MULTIPLE VITAMINS/WOMENS PO), Take by mouth., Disp: , Rfl:  .  zolmitriptan (ZOMIG) 5 MG tablet, Take 1 tablet (5 mg total) by mouth as needed for migraine. May repeat X 1 after 2 hrs. Max: 10 mg/24hrs., Disp: 9 tablet, Rfl: 2  Allergies  Allergen Reactions  . Erythromycin      ROS  Constitutional: Negative for fever or weight change.  Respiratory: Negative for cough and shortness of breath.   Cardiovascular: Negative for chest pain or palpitations.  Gastrointestinal: Negative for abdominal pain, no bowel changes.  Musculoskeletal: Negative for gait problem or joint swelling.  Skin: Negative for rash.  Neurological: Negative for dizziness , positive for intermittent headache.  No other specific complaints in a complete review of systems (except as listed in HPI above).  Objective  Vitals:    08/11/18 1050  BP: 102/60  Pulse: 92  Resp: 16  Temp: 97.8 F (36.6 C)  TempSrc: Oral  SpO2: 98%  Weight: 134 lb 1.6 oz (60.8 kg)  Height: _0  (1.727 m)    Body mass index is 20.39 kg/m.  Physical Exam  Constitutional: Patient appears well-developed and well-nourished. No distress.  HENT: Head: Normocephalic and atraumatic. Ears: B TMs ok, no erythema or effusion; Nose: Nose normal. Mouth/Throat: Oropharynx is clear and moist. No oropharyngeal  exudate.  Eyes: Conjunctivae and EOM are normal. Pupils are equal, round, and reactive to light. No scleral icterus.  Neck: Normal range of motion. Neck supple. No JVD present. No thyromegaly present.  Cardiovascular: Normal rate, regular rhythm and normal heart sounds.  No murmur heard. No BLE edema. Pulmonary/Chest: Effort normal and breath sounds normal. No respiratory distress. Abdominal: Soft. Bowel sounds are normal, no distension. There is no tenderness. no masses Breast: no lumps or masses, no nipple discharge or rashes FEMALE GENITALIA:  External genitalia normal External urethra normal Vaginal vault showed atrophy with some stenosis, without discharge or lesions Cervix normal without discharge or lesions Bimanual exam normal without masses RECTAL: not done Musculoskeletal: Normal range of motion, no joint effusions. No gross deformities Neurological: he is alert and oriented to person, place, and time. No cranial nerve deficit. Coordination, balance, strength, speech and gait are normal.  Skin: Skin is warm and dry. No rash noted. No erythema.  Psychiatric: Patient has a normal mood and affect. behavior is normal. Judgment and thought content normal.  PHQ2/9: Depression screen [facility] 2/9 08/11/2018 05/08/2018 10/30/2015 07/16/2015  Decreased Interest 0 0 0 0  Down, Depressed, Hopeless 0 0 0 0  PHQ - 2 Score 0 0 0 0  Altered sleeping 0 - - -  Tired, decreased energy 0 - - -  Change in appetite 0 - - -  Feeling bad or failure  about yourself  0 - - -  Trouble concentrating 0 - - -  Moving slowly or fidgety/restless 0 - - -  Suicidal thoughts 0 - - -  PHQ-9 Score 0 - - -  Difficult doing work/chores Not difficult at all - - -     Fall Risk: Fall Risk  08/11/2018 05/08/2018 10/30/2015 07/16/2015  Falls in the past year? 0 No No No     Functional Status Survey: Is the patient deaf or have difficulty hearing?: No Does the patient have difficulty seeing, even when wearing glasses/contacts?: No Does the patient have difficulty concentrating, remembering, or making decisions?: No Does the patient have difficulty walking or climbing stairs?: No Does the patient have difficulty dressing or bathing?: No Does the patient have difficulty doing errands alone such as visiting a doctor's office or shopping?: No  Assessment & Plan  1. Well woman exam   2. Cervical cancer screening  - Cytology - PAP   -USPSTF grade A and B recommendations reviewed with patient; age-appropriate recommendations, preventive care, screening tests, etc discussed and encouraged; healthy living encouraged; see AVS for patient education given to patient -Discussed importance of 150 minutes of physical activity weekly, eat two servings of fish weekly, eat one serving of tree nuts ( cashews, pistachios, pecans, almonds.Marland Kitchen) every other day, eat 6 servings of fruit/vegetables daily and drink plenty of water and avoid sweet beverages.

## 2018-08-15 LAB — CYTOLOGY - PAP
DIAGNOSIS: NEGATIVE
HPV (WINDOPATH): NOT DETECTED

## 2018-08-21 ENCOUNTER — Ambulatory Visit: Payer: Self-pay

## 2018-08-21 NOTE — Telephone Encounter (Signed)
Pt. Called to report that her [facility] reports they never received her Estace vaginal cream. Please resend.

## 2018-08-21 NOTE — Telephone Encounter (Signed)
Spoke with [facility] and they had Estrace on file, but stated for some reason it was put on hold. Asked them to fill it per patient request. I then called Diana Stevens and left her a voicemail on her home phone to inform her that Diana Stevens would be filling her prescription today and informed her about coupon vouchers online that are available for her to print and take into the pharmacy.

## 2018-09-25 ENCOUNTER — Other Ambulatory Visit: Payer: Self-pay

## 2018-09-25 DIAGNOSIS — G43009 Migraine without aura, not intractable, without status migrainosus: Secondary | ICD-10-CM

## 2018-09-25 NOTE — Telephone Encounter (Signed)
Refill request for general medication: Zomig 5 mg  Last office visit: 08/11/18  Last physical exam: 08/11/18  Follow-ups on file. 08/15/2019

## 2018-09-26 MED ORDER — ZOLMITRIPTAN 5 MG PO TABS: 5.0000 mg | ORAL_TABLET | ORAL | 1 refills | Status: DC | PRN

## 2018-10-02 ENCOUNTER — Telehealth: Payer: Self-pay

## 2018-10-02 DIAGNOSIS — G43009 Migraine without aura, not intractable, without status migrainosus: Secondary | ICD-10-CM

## 2018-10-02 NOTE — Telephone Encounter (Signed)
[facility]   Last office visit 08/11/2018   Follow up on 08/15/2019

## 2018-10-03 ENCOUNTER — Other Ambulatory Visit: Payer: Self-pay | Admitting: Family Medicine

## 2018-10-03 DIAGNOSIS — G43009 Migraine without aura, not intractable, without status migrainosus: Secondary | ICD-10-CM

## 2018-10-03 MED ORDER — ZOLMITRIPTAN 5 MG PO TABS: 5.0000 mg | ORAL_TABLET | ORAL | 0 refills | Status: DC | PRN

## 2018-10-03 MED ORDER — ZOLMITRIPTAN 5 MG PO TABS: 5.0000 mg | ORAL_TABLET | ORAL | 1 refills | Status: DC | PRN

## 2018-10-03 NOTE — Telephone Encounter (Signed)
Pt said that this medication needs to go to [facility] not [facility]. Please resend also made an appt Nov 07 2018. Tiffany please show to Dr Ancil Boozer

## 2018-10-03 NOTE — Telephone Encounter (Signed)
Sending refill but due for follow up Feb or March

## 2018-11-07 ENCOUNTER — Ambulatory Visit (INDEPENDENT_AMBULATORY_CARE_PROVIDER_SITE_OTHER): Payer: 59 | Admitting: Family Medicine

## 2018-11-07 ENCOUNTER — Encounter: Payer: Self-pay | Admitting: Family Medicine

## 2018-11-07 VITALS — BP 108/64 | HR 97 | Temp 97.9°F | Resp 18 | Ht 68.0 in | Wt 135.3 lb

## 2018-11-07 DIAGNOSIS — N952 Postmenopausal atrophic vaginitis: Secondary | ICD-10-CM

## 2018-11-07 DIAGNOSIS — G43009 Migraine without aura, not intractable, without status migrainosus: Secondary | ICD-10-CM

## 2018-11-07 MED ORDER — UBROGEPANT 50 MG PO TABS: 1.0000 | ORAL_TABLET | Freq: Every day | ORAL | 1 refills | Status: DC | PRN

## 2018-11-07 NOTE — Progress Notes (Signed)
Name: Diana Stevens   MRN: [id number]    DOB: August 01, 1961   Date:11/07/2018       Progress Note  Subjective  Chief Complaint  Chief Complaint  Patient presents with  . Follow-up  . Migraine    Had 2 in January     HPI   Migraine headaches: she has a long history of migraines . Started in her early 20's, migraine is described as frontal headache that radiates to nuchal area or radiates to her teeth. Pain is described as tightness and aching. She denies photophobia, but has phonophobia. Sometimes associated with nausea but no vomiting. Tried multiple medications and only responds to Zomig ( failed Imitrex, Excedrin migraine, tylenol, Aleve) She states she has on average one to two episode per month. Once she takes Zomig symptoms resolves within one hour, occasionally it can last 24 hours if she does not take it right away and has to take another dose.   Vaginal atrophy: we gave her premarin cream rx but she got confused, since I told her to use topically outside , she has some dyspareunia ,but states not very sexually active, gave both options on how to use medication    Patient Active Problem List   Diagnosis Date Noted  . Ganglion cyst of wrist, right 05/08/2018  . Headache, migraine 07/16/2015    Past Surgical History:  Procedure Laterality Date  . CHOLECYSTECTOMY  1990  . COLONOSCOPY  05/28/2012   normal, repeat in 10 yrs  . FOOT SURGERY Left    twice  . TONSILLECTOMY      Family History  Adopted: Yes  Problem Relation Age of Onset  . Bladder Cancer Maternal Grandmother   . Stroke Maternal Grandmother   . Heart disease Maternal Grandmother   . Stroke Paternal Grandmother        health not good  . Heart disease Paternal Grandfather        mid 15's  . Brain cancer Paternal Aunt   . Suicidality Paternal Aunt   . Depression Maternal Uncle        Hospitalized for several months due to depression as an adult  . Leukemia Cousin   . Breast cancer Neg Hx     Social History    Socioeconomic History  . Marital status: Married    Spouse name: Ulice Dash   . Number of children: 3  . Years of education: Not on file  . Highest education level: Associate degree: academic program  Occupational History  . Occupation: Risk manager   Social Needs  . Financial resource strain: Not hard at all  . Food insecurity:    Worry: Never true    Inability: Never true  . Transportation needs:    Medical: No    Non-medical: No  Tobacco Use  . Smoking status: Never Smoker  . Smokeless tobacco: Never Used  . Tobacco comment: patient was a social smoker, very light use (1pk a month)  Substance and Sexual Activity  . Alcohol use: Yes    Alcohol/week: 0.0 standard drinks    Comment: occasional  . Drug use: No  . Sexual activity: Yes    Partners: Male  Lifestyle  . Physical activity:    Days per week: 5 days    Minutes per session: 60 min  . Stress: Not at all  Relationships  . Social connections:    Talks on phone: Once a week    Gets together: Once a week    Attends religious service:  More than 4 times per year    Active member of club or organization: No    Attends meetings of clubs or organizations: Never    Relationship status: Married  . Intimate partner violence:    Fear of current or ex partner: No    Emotionally abused: No    Physically abused: No    Forced sexual activity: No  Other Topics Concern  . Not on file  Social History Narrative   Married, adopted 3 daughters      Current Outpatient Medications:  .  CALCIUM CARBONATE-VIT D-MIN PO, Take by mouth., Disp: , Rfl:  .  diphenhydrAMINE (BENADRYL ALLERGY) 25 mg capsule, Take 1 capsule (25 mg total) by mouth every 6 (six) hours as needed., Disp: 30 capsule, Rfl: 0 .  Multiple Vitamins-Minerals (MULTIPLE VITAMINS/WOMENS PO), Take by mouth., Disp: , Rfl:  .  zolmitriptan (ZOMIG) 5 MG tablet, Take 1 tablet (5 mg total) by mouth as needed for migraine. May repeat X 1 after 2 hrs. Max: 10 mg/24hrs.,  Disp: 27 tablet, Rfl: 0 .  estradiol (ESTRACE) 0.1 MG/GM vaginal cream, Place 1 Applicatorful vaginally at bedtime. (Patient not taking: Reported on 11/07/2018), Disp: 42.5 g, Rfl: 12  Allergies  Allergen Reactions  . Erythromycin     I personally reviewed active problem list, medication list, allergies, family history, social history with the patient/caregiver today.   ROS  Ten systems reviewed and is negative except as mentioned in HPI   Objective  Vitals:   11/07/18 1432  BP: 108/64  Pulse: 97  Resp: 18  Temp: 97.9 F (36.6 C)  TempSrc: Oral  SpO2: 99%  Weight: 135 lb 4.8 oz (61.4 kg)  Height: 5\' 8"  (1.727 m)    Body mass index is 20.57 kg/m.  Physical Exam  Constitutional: Patient appears well-developed and well-nourished. No distress.  HEENT: head atraumatic, normocephalic, pupils equal and reactive to light,  neck supple, throat within normal limits Cardiovascular: Normal rate, regular rhythm and normal heart sounds.  No murmur heard. No BLE edema. Pulmonary/Chest: Effort normal and breath sounds normal. No respiratory distress. Abdominal: Soft.  There is no tenderness. Neurological exam: normal exam Psychiatric: Patient has a normal mood and affect. behavior is normal. Judgment and thought content normal.  Recent Results (from the past 2160 hour(s))  Cytology - PAP     Status: None   Collection Time: 08/11/18 12:00 AM  Result Value Ref Range   Adequacy      Satisfactory for evaluation. The presence or absence of an endocervical / transformation zone component cannot be determined because of atrophy.   Diagnosis      NEGATIVE FOR INTRAEPITHELIAL LESIONS OR MALIGNANCY.   HPV NOT DETECTED     Comment: Normal Reference Range - NOT Detected   Material Submitted CervicoVaginal Pap [ThinPrep Imaged]    CYTOLOGY - PAP PAP RESULT       PHQ2/9: Depression screen [facility] 2/9 11/07/2018 08/11/2018 05/08/2018 10/30/2015 07/16/2015  Decreased Interest 0 0 0 0 0  Down,  Depressed, Hopeless 0 0 0 0 0  PHQ - 2 Score 0 0 0 0 0  Altered sleeping - 0 - - -  Tired, decreased energy - 0 - - -  Change in appetite - 0 - - -  Feeling bad or failure about yourself  - 0 - - -  Trouble concentrating - 0 - - -  Moving slowly or fidgety/restless - 0 - - -  Suicidal thoughts - 0 - - -  PHQ-9 Score - 0 - - -  Difficult doing work/chores - Not difficult at all - - -    Fall Risk: Fall Risk  11/07/2018 08/11/2018 05/08/2018 10/30/2015 07/16/2015  Falls in the past year? 0 0 No No No     Functional Status Survey: Is the patient deaf or have difficulty hearing?: No Does the patient have difficulty seeing, even when wearing glasses/contacts?: No Does the patient have difficulty concentrating, remembering, or making decisions?: No Does the patient have difficulty walking or climbing stairs?: No Does the patient have difficulty dressing or bathing?: No Does the patient have difficulty doing errands alone such as visiting a doctor's office or shopping?: No   Assessment & Plan  1. Migraine without aura and without status migrainosus, not intractable  - Ubrogepant (UBRELVY) 50 MG TABS; Take 1-2 tablets by mouth daily as needed. Max 200 mg daily  Dispense: 10 tablet; Refill: 1  2. Vaginal atrophy

## 2019-04-12 ENCOUNTER — Other Ambulatory Visit: Payer: Self-pay | Admitting: Family Medicine

## 2019-04-12 DIAGNOSIS — G43009 Migraine without aura, not intractable, without status migrainosus: Secondary | ICD-10-CM

## 2019-04-12 NOTE — Telephone Encounter (Signed)
Refill request for general medication. Zomig   Last office visit 11/07/2018   Follow up on 08/15/2019

## 2019-05-24 ENCOUNTER — Other Ambulatory Visit: Payer: Self-pay | Admitting: Family Medicine

## 2019-05-24 DIAGNOSIS — G43009 Migraine without aura, not intractable, without status migrainosus: Secondary | ICD-10-CM

## 2019-05-24 NOTE — Telephone Encounter (Signed)
Requested medication (s) are due for refill today:yes Requested medication (s) are on the active medication list: yes Last refill:  04/12/2019  Future visit scheduled: yes  Notes to clinic:  Review for refill   Requested Prescriptions  Pending Prescriptions Disp Refills   zolmitriptan (ZOMIG) 5 MG tablet [Pharmacy Med Name: ZOLMITRIPTAN TABS 1'S 5MG ] 27 tablet 5    Sig: TAKE 1 TABLET AS NEEDED FOR MIGRAINE, MAY REPEAT ONE TIME AFTER 2 HOURS, MAXIMUM 10 MG (2 TABLETS) IN 24 HOURS     Neurology:  Migraine Therapy - Triptan Passed - 05/24/2019  1:02 AM      Passed - Last BP in normal range    BP Readings from Last 1 Encounters:  11/07/18 108/64         Passed - Valid encounter within last 12 months    Recent Outpatient Visits          6 months ago Migraine without aura and without status migrainosus, not intractable   [facility] Steele Sizer, MD   9 months ago Well woman exam   [facility] [place], Drue Stager, MD   1 year ago Migraine without aura and without status migrainosus, not intractable   [facility] [place], Drue Stager, MD   2 years ago Chronic migraine without aura without status migrainosus, not intractable   [facility] Roselee Nova, MD   3 years ago [facility] Steele Sizer, MD      Future Appointments            In 2 months Ancil Boozer, Drue Stager, MD [facility]

## 2019-08-15 ENCOUNTER — Encounter: Payer: 59 | Admitting: Family Medicine

## 2019-10-10 ENCOUNTER — Encounter: Payer: 59 | Admitting: Family Medicine

## 2019-11-09 ENCOUNTER — Other Ambulatory Visit: Payer: Self-pay

## 2019-11-09 ENCOUNTER — Encounter: Payer: Self-pay | Admitting: Family Medicine

## 2019-11-09 ENCOUNTER — Ambulatory Visit (INDEPENDENT_AMBULATORY_CARE_PROVIDER_SITE_OTHER): Payer: 59 | Admitting: Family Medicine

## 2019-11-09 VITALS — BP 94/70 | HR 92 | Temp 97.5°F | Resp 16 | Ht 68.0 in | Wt 134.9 lb

## 2019-11-09 DIAGNOSIS — G43009 Migraine without aura, not intractable, without status migrainosus: Secondary | ICD-10-CM | POA: Diagnosis not present

## 2019-11-09 DIAGNOSIS — Z0000 Encounter for general adult medical examination without abnormal findings: Secondary | ICD-10-CM

## 2019-11-09 DIAGNOSIS — Z1231 Encounter for screening mammogram for malignant neoplasm of breast: Secondary | ICD-10-CM | POA: Diagnosis not present

## 2019-11-09 DIAGNOSIS — N952 Postmenopausal atrophic vaginitis: Secondary | ICD-10-CM

## 2019-11-09 MED ORDER — NURTEC 75 MG PO TBDP: 1.0000 | ORAL_TABLET | Freq: Every day | ORAL | 0 refills | Status: DC | PRN

## 2019-11-09 NOTE — Patient Instructions (Addendum)
Check for shingrix   Preventive Care 37-57 Years Old, Female Preventive care refers to visits with your health care provider and lifestyle choices that can promote health and wellness. This includes:  A yearly physical exam. This may also be called an annual well check.  Regular dental visits and eye exams.  Immunizations.  Screening for certain conditions.  Healthy lifestyle choices, such as eating a healthy diet, getting regular exercise, not using drugs or products that contain nicotine and tobacco, and limiting alcohol use. What can I expect for my preventive care visit? Physical exam Your health care provider will check your:  Height and weight. This may be used to calculate body mass index (BMI), which tells if you are at a healthy weight.  Heart rate and blood pressure.  Skin for abnormal spots. Counseling Your health care provider may ask you questions about your:  Alcohol, tobacco, and drug use.  Emotional well-being.  Home and relationship well-being.  Sexual activity.  Eating habits.  Work and work Statistician.  Method of birth control.  Menstrual cycle.  Pregnancy history. What immunizations do I need?  Influenza (flu) vaccine  This is recommended every year. Tetanus, diphtheria, and pertussis (Tdap) vaccine  You may need a Td booster every 10 years. Varicella (chickenpox) vaccine  You may need this if you have not been vaccinated. Zoster (shingles) vaccine  You may need this after age 57. Measles, mumps, and rubella (MMR) vaccine  You may need at least one dose of MMR if you were born in 1957 or later. You may also need a second dose. Pneumococcal conjugate (PCV13) vaccine  You may need this if you have certain conditions and were not previously vaccinated. Pneumococcal polysaccharide (PPSV23) vaccine  You may need one or two doses if you smoke cigarettes or if you have certain conditions. Meningococcal conjugate (MenACWY) vaccine  You  may need this if you have certain conditions. Hepatitis A vaccine  You may need this if you have certain conditions or if you travel or work in places where you may be exposed to hepatitis A. Hepatitis B vaccine  You may need this if you have certain conditions or if you travel or work in places where you may be exposed to hepatitis B. Haemophilus influenzae type b (Hib) vaccine  You may need this if you have certain conditions. Human papillomavirus (HPV) vaccine  If recommended by your health care provider, you may need three doses over 6 months. You may receive vaccines as individual doses or as more than one vaccine together in one shot (combination vaccines). Talk with your health care provider about the risks and benefits of combination vaccines. What tests do I need? Blood tests  Lipid and cholesterol levels. These may be checked every 5 years, or more frequently if you are over 74 years old.  Hepatitis C test.  Hepatitis B test. Screening  Lung cancer screening. You may have this screening every year starting at age 57 if you have a 30-pack-year history of smoking and currently smoke or have quit within the past 15 years.  Colorectal cancer screening. All adults should have this screening starting at age 57 and continuing until age 32. Your health care provider may recommend screening at age 73 if you are at increased risk. You will have tests every 1-10 years, depending on your results and the type of screening test.  Diabetes screening. This is done by checking your blood sugar (glucose) after you have not eaten for a while (  fasting). You may have this done every 1-3 years.  Mammogram. This may be done every 1-2 years. Talk with your health care provider about when you should start having regular mammograms. This may depend on whether you have a family history of breast cancer.  BRCA-related cancer screening. This may be done if you have a family history of breast, ovarian,  tubal, or peritoneal cancers.  Pelvic exam and Pap test. This may be done every 3 years starting at age 57. Starting at age 57, this may be done every 5 years if you have a Pap test in combination with an HPV test. Other tests  Sexually transmitted disease (STD) testing.  Bone density scan. This is done to screen for osteoporosis. You may have this scan if you are at high risk for osteoporosis. Follow these instructions at home: Eating and drinking  Eat a diet that includes fresh fruits and vegetables, whole grains, lean protein, and low-fat dairy.  Take vitamin and mineral supplements as recommended by your health care provider.  Do not drink alcohol if: ? Your health care provider tells you not to drink. ? You are pregnant, may be pregnant, or are planning to become pregnant.  If you drink alcohol: ? Limit how much you have to 0-1 drink a day. ? Be aware of how much alcohol is in your drink. In the [place] one drink equals one 12 oz bottle of beer (355 mL), one 5 oz glass of wine (148 mL), or one 1 oz glass of hard liquor (44 mL). Lifestyle  Take daily care of your teeth and gums.  Stay active. Exercise for at least 30 minutes on 5 or more days each week.  Do not use any products that contain nicotine or tobacco, such as cigarettes, e-cigarettes, and chewing tobacco. If you need help quitting, ask your health care provider.  If you are sexually active, practice safe sex. Use a condom or other form of birth control (contraception) in order to prevent pregnancy and STIs (sexually transmitted infections).  If told by your health care provider, take low-dose aspirin daily starting at age 57. What's next?  Visit your health care provider once a year for a well check visit.  Ask your health care provider how often you should have your eyes and teeth checked.  Stay up to date on all vaccines. This information is not intended to replace advice given to you by your health care  provider. Make sure you discuss any questions you have with your health care provider. Document Revised: 05/25/2018 Document Reviewed: 05/25/2018 Elsevier Patient Education  2020 [employer].

## 2019-11-09 NOTE — Progress Notes (Signed)
Name: Diana Stevens   MRN: [id number]    DOB: [birth year]   Date:11/09/2019       Progress Note  Subjective  Chief Complaint  Chief Complaint  Patient presents with  . Annual Exam    HPI  Patient presents for annual CPE and follow up  Migraine headaches: she has a long history of migraines . Started in her early 20's, migraine is described as frontal headache that radiates to nuchal area or radiates to her teeth. Pain is described as tightness and aching not usually throbbing . She denies photophobia, but has phonophobia. Sometimes associated with nausea but no vomiting. Tried multiple medications and only responds to Zomig ( failed Imitrex, Excedrin migraine, tylenol, Aleve) She states she has on average one to two episode per month. Once she takes Zomig symptoms resolves within one hour, occasionally it can last 24 hours if she does not take it right away and has to take another dose. We gave her Roselyn Meier to try taking it if the first dose of Zomig did not work. Occasionally she has to take two doses but it resolves with medication   Vaginal atrophy: she premarin in her medicine cabinet but forgets to use it. She has not sexually active lately, it does not bother him much  Diet: balanced, she eats dairy daily but not a lot  Exercise:   USPSTF grade A and B recommendations    Office Visit from 11/09/2019 in [facility]  AUDIT-C Score  0     Depression: Phq 9 is  negative Depression screen [facility] 2/9 11/09/2019 11/07/2018 08/11/2018 05/08/2018 10/30/2015  Decreased Interest 0 0 0 0 0  Down, Depressed, Hopeless 0 0 0 0 0  PHQ - 2 Score 0 0 0 0 0  Altered sleeping 0 - 0 - -  Tired, decreased energy 0 - 0 - -  Change in appetite 0 - 0 - -  Feeling bad or failure about yourself  0 - 0 - -  Trouble concentrating 0 - 0 - -  Moving slowly or fidgety/restless 0 - 0 - -  Suicidal thoughts 0 - 0 - -  PHQ-9 Score 0 - 0 - -  Difficult doing work/chores - - Not difficult at all - -    Hypertension: BP Readings from Last 3 Encounters:  11/09/19 94/70  11/07/18 108/64  08/11/18 102/60   Obesity: Wt Readings from Last 3 Encounters:  11/09/19 134 lb 14.4 oz (61.2 kg)  11/07/18 135 lb 4.8 oz (61.4 kg)  08/11/18 134 lb 1.6 oz (60.8 kg)   BMI Readings from Last 3 Encounters:  11/09/19 20.51 kg/m  11/07/18 20.57 kg/m  08/11/18 20.39 kg/m     Hep C Screening: 2019  STD testing and prevention (HIV/chl/gon/syphilis): not interested  Intimate partner violence:negative  Sexual History (Partners/Practices/Protection from [employer] hx STI/Pregnancy Plans): not currently sexually active, advised to discuss it with her husband Pain during Intercourse: used to be painful, but not currently sexually active  Menstrual History/LMP/Abnormal Bleeding: she states LMP October 2020 Incontinence Symptoms:   Breast cancer:  - Last Mammogram: 05/2018  - BRCA gene screening: N/A  Osteoporosis: Discussed high calcium and vitamin D supplementation, weight bearing exercises  Cervical cancer screening:   Skin cancer: Discussed monitoring for atypical lesions  Colorectal cancer:repeat in 2022  Lung cancer:   Low Dose CT Chest recommended if Age 41-80 years, 30 pack-year currently smoking OR have quit w/in 15years. Patient does not qualify.   ECG: discussed  with patient we will hold off   Advanced Care Planning: A voluntary discussion about advance care planning including the explanation and discussion of advance directives.  Discussed health care proxy and Living will, and the patient was able to identify a health care proxy as husband   Patient does not know have a living will at present time.  Lipids: Lab Results  Component Value Date   CHOL 179 03/10/2018   Lab Results  Component Value Date   HDL 73 (A) 03/10/2018   Lab Results  Component Value Date   LDLCALC 89 03/10/2018   Lab Results  Component Value Date   TRIG 84 03/10/2018   No results found for: CHOLHDL No  results found for: LDLDIRECT  Glucose: No results found for: GLUCOSE, GLUCAP  Patient Active Problem List   Diagnosis Date Noted  . Ganglion cyst of wrist, right 05/08/2018  . Headache, migraine 07/16/2015    Past Surgical History:  Procedure Laterality Date  . CHOLECYSTECTOMY  1990  . COLONOSCOPY  05/28/2012   normal, repeat in 10 yrs  . FOOT SURGERY Left    twice  . TONSILLECTOMY      Family History  Adopted: Yes  Problem Relation Age of Onset  . Bladder Cancer Maternal Grandmother   . Stroke Maternal Grandmother   . Heart disease Maternal Grandmother   . Stroke Paternal Grandmother        health not good  . Heart disease Paternal Grandfather        mid 43's  . Brain cancer Paternal Aunt   . Suicidality Paternal Aunt   . Depression Maternal Uncle        Hospitalized for several months due to depression as an adult  . Leukemia Cousin   . Breast cancer Neg Hx     Social History   Socioeconomic History  . Marital status: Married    Spouse name: Ulice Dash   . Number of children: 3  . Years of education: Not on file  . Highest education level: Associate degree: academic program  Occupational History  . Occupation: Risk manager   Tobacco Use  . Smoking status: Never Smoker  . Smokeless tobacco: Never Used  . Tobacco comment: patient was a social smoker, very light use (1pk a month)  Substance and Sexual Activity  . Alcohol use: Yes    Alcohol/week: 0.0 standard drinks    Comment: occasional  . Drug use: No  . Sexual activity: Yes    Partners: Male  Other Topics Concern  . Not on file  Social History Narrative   Married, adopted 3 daughters    She still has one daughter at home    Social Determinants of Health   Financial Resource Stevens: Low Risk   . Difficulty of Paying Living Expenses: Not hard at all  Food Insecurity: No Food Insecurity  . Worried About Charity fundraiser in the Last Year: Never true  . Ran Out of Food in the Last Year: Never true   Transportation Needs: No Transportation Needs  . Lack of Transportation (Medical): No  . Lack of Transportation (Non-Medical): No  Physical Activity: Sufficiently Active  . Days of Exercise per Week: 4 days  . Minutes of Exercise per Session: 60 min  Stress: No Stress Concern Present  . Feeling of Stress : Not at all  Social Connections: Not Isolated  . Frequency of Communication with Friends and Family: More than three times a week  . Frequency of Social  Gatherings with Friends and Family: More than three times a week  . Attends Religious Services: More than 4 times per year  . Active Member of Clubs or Organizations: Yes  . Attends Archivist Meetings: More than 4 times per year  . Marital Status: Married  Human resources officer Violence: Not At Risk  . Fear of Current or Ex-Partner: No  . Emotionally Abused: No  . Physically Abused: No  . Sexually Abused: No     Current Outpatient Medications:  .  CALCIUM CARBONATE-VIT D-MIN PO, Take by mouth., Disp: , Rfl:  .  diphenhydrAMINE (BENADRYL ALLERGY) 25 mg capsule, Take 1 capsule (25 mg total) by mouth every 6 (six) hours as needed., Disp: 30 capsule, Rfl: 0 .  estradiol (ESTRACE) 0.1 MG/GM vaginal cream, Place 1 Applicatorful vaginally at bedtime., Disp: 42.5 g, Rfl: 12 .  Multiple Vitamins-Minerals (MULTIPLE VITAMINS/WOMENS PO), Take by mouth., Disp: , Rfl:  .  triazolam (HALCION) 0.25 MG tablet, TAKE 2 TABLETS BY MOUTH ONE HOUR PRIOR TO DENTAL APPOINTMENT, Disp: , Rfl:  .  zolmitriptan (ZOMIG) 5 MG tablet, TAKE 1 TABLET AS NEEDED FOR MIGRAINE, MAY REPEAT ONE TIME AFTER 2 HOURS, MAXIMUM 10 MG (2 TABLETS) IN 24 HOURS, Disp: 27 tablet, Rfl: 5 .  Ubrogepant (UBRELVY) 50 MG TABS, Take 1-2 tablets by mouth daily as needed. Max 200 mg daily (Patient not taking: Reported on 11/09/2019), Disp: 10 tablet, Rfl: 1  Allergies  Allergen Reactions  . Erythromycin      ROS  Constitutional: Negative for fever or weight change.   Respiratory: Negative for cough and shortness of breath.   Cardiovascular: Negative for chest pain or palpitations.  Gastrointestinal: Negative for abdominal pain, no bowel changes.  Musculoskeletal: Negative for gait problem or joint swelling.  Skin: Negative for rash.  Neurological: Negative for dizziness , positive for intermittent  headache.  No other specific complaints in a complete review of systems (except as listed in HPI above).  Objective  Vitals:   11/09/19 1126  BP: 94/70  Pulse: 92  Resp: 16  Temp: (!) 97.5 F (36.4 C)  TempSrc: Temporal  SpO2: 98%  Weight: 134 lb 14.4 oz (61.2 kg)  Height: '5\' 8"'  (1.727 m)    Body mass index is 20.51 kg/m.  Physical Exam  Constitutional: Patient appears well-developed and well-nourished. No distress.  HENT: Head: Normocephalic and atraumatic. Ears: B TMs ok, no erythema or effusion; Nose: Not done Mouth/Throat: not done Eyes: Conjunctivae and EOM are normal. Pupils are equal, round, and reactive to light. No scleral icterus.  Neck: Normal range of motion. Neck supple. No JVD present. No thyromegaly present.  Cardiovascular: Normal rate, regular rhythm and normal heart sounds.  No murmur heard. No BLE edema. Pulmonary/Chest: Effort normal and breath sounds normal. No respiratory distress. Abdominal: Soft. Bowel sounds are normal, no distension. There is no tenderness. no masses Breast: no lumps or masses, no nipple discharge or rashes FEMALE GENITALIA:  Not done  RECTAL: not done Musculoskeletal: Normal range of motion, no joint effusions. No gross deformities Neurological: he is alert and oriented to person, place, and time. No cranial nerve deficit. Coordination, balance, strength, speech and gait are normal.  Skin: Skin is warm and dry. No rash noted. No erythema.  Psychiatric: Patient has a normal mood and affect. behavior is normal. Judgment and thought content normal.  Fall Risk: Fall Risk  11/09/2019 11/07/2018  08/11/2018 05/08/2018 10/30/2015  Falls in the past year? 0 0 0 No No  Number falls in past yr: 0 - - - -  Injury with Fall? 0 - - - -    Functional Status Survey: Is the patient deaf or have difficulty hearing?: No Does the patient have difficulty seeing, even when wearing glasses/contacts?: No Does the patient have difficulty concentrating, remembering, or making decisions?: No Does the patient have difficulty walking or climbing stairs?: No Does the patient have difficulty dressing or bathing?: No Does the patient have difficulty doing errands alone such as visiting a doctor's office or shopping?: No   Assessment & Plan  1. Well adult exam   2. Migraine without aura and without status migrainosus, not intractable  We will try adding another medication today   3. Vaginal atrophy  She still has premarin at home   4. Breast cancer screening by mammogram  - MM 3D SCREEN BREAST BILATERAL; Future  -USPSTF grade A and B recommendations reviewed with patient; age-appropriate recommendations, preventive care, screening tests, etc discussed and encouraged; healthy living encouraged; see AVS for patient education given to patient -Discussed importance of 150 minutes of physical activity weekly, eat two servings of fish weekly, eat one serving of tree nuts ( cashews, pistachios, pecans, almonds.Marland Kitchen) every other day, eat 6 servings of fruit/vegetables daily and drink plenty of water and avoid sweet beverages.

## 2019-12-18 ENCOUNTER — Ambulatory Visit
Admission: RE | Admit: 2019-12-18 | Discharge: 2019-12-18 | Disposition: A | Discharge status: Home / Self Care | Payer: 59 | Source: Ambulatory Visit | Attending: Family Medicine | Admitting: Family Medicine

## 2019-12-18 ENCOUNTER — Encounter: Payer: Self-pay | Admitting: Radiology

## 2019-12-18 DIAGNOSIS — Z1231 Encounter for screening mammogram for malignant neoplasm of breast: Secondary | ICD-10-CM | POA: Insufficient documentation

## 2020-06-27 ENCOUNTER — Other Ambulatory Visit: Payer: Self-pay | Admitting: Family Medicine

## 2020-06-27 DIAGNOSIS — G43009 Migraine without aura, not intractable, without status migrainosus: Secondary | ICD-10-CM

## 2020-10-20 ENCOUNTER — Other Ambulatory Visit: Payer: Self-pay

## 2020-10-20 DIAGNOSIS — G43009 Migraine without aura, not intractable, without status migrainosus: Secondary | ICD-10-CM

## 2020-10-20 MED ORDER — ZOLMITRIPTAN 5 MG PO TABS: ORAL_TABLET | ORAL | 0 refills | Status: DC

## 2020-11-07 NOTE — Progress Notes (Signed)
Name: Diana Stevens   MRN: [id number]    DOB: Feb 03, 1961   Date:11/10/2020       Progress Note  Subjective  Chief Complaint  Annual Exam  HPI  Patient presents for annual CPE and follow up, she is aware may have extra cost to this visit   Migraine headaches: she has a long history of migraines . Started in her early 20's, migraine is described as frontal headache that radiates to nuchal area and sometimes radiates to her molars . Pain is described as tightness and aching not usually throbbing . She denies photophobia, but has phonophobia. Rarely associated with nausea but no vomiting. Tried multiple medications and only responds to Zomig ( failed Imitrex, Excedrin migraine, tylenol, Aleve) She states she has on average oneto twoepisode per month, but sometimes has to take a second dose. We gave her a rx of Nurtec last year but she did not activate card therefore never tried   Diet: balanced diet  Exercise: continue physical activity   [place] Office Visit from 11/09/2019 in [facility]  AUDIT-C Score 0     Depression: Phq 9 is  negative Depression screen [facility] 2/9 11/10/2020 11/09/2019 11/07/2018 08/11/2018 05/08/2018  Decreased Interest 0 0 0 0 0  Down, Depressed, Hopeless 0 0 0 0 0  PHQ - 2 Score 0 0 0 0 0  Altered sleeping 0 0 - 0 -  Tired, decreased energy 0 0 - 0 -  Change in appetite 0 0 - 0 -  Feeling bad or failure about yourself  0 0 - 0 -  Trouble concentrating 0 0 - 0 -  Moving slowly or fidgety/restless 0 0 - 0 -  Suicidal thoughts 0 0 - 0 -  PHQ-9 Score 0 0 - 0 -  Difficult doing work/chores - - - Not difficult at all -   Hypertension: BP Readings from Last 3 Encounters:  11/10/20 112/78  11/09/19 94/70  11/07/18 108/64   Obesity: Wt Readings from Last 3 Encounters:  11/10/20 136 lb 14.4 oz (62.1 kg)  11/09/19 134 lb 14.4 oz (61.2 kg)  11/07/18 135 lb 4.8 oz (61.4 kg)   BMI Readings from Last 3 Encounters:  11/10/20 20.82 kg/m   11/09/19 20.51 kg/m  11/07/18 20.57 kg/m     Vaccines:   Shingrix: 73-64 yo and ask insurance if covered when patient above 75 yo Pneumonia: educated and discussed with patient. Flu: educated and discussed with patient.  Hep C Screening: 05/08/18 STD testing and prevention (HIV/chl/gon/syphilis): N/A Intimate partner violence: negative Sexual History : not lately, we gave her premarin but she forgot to use it.  Menstrual History/LMP/Abnormal Bleeding: discussed post-menopausal bleeding  Incontinence Symptoms: no problems   Breast cancer:  - Last Mammogram: 12/18/19 - BRCA gene screening: she was adopted, discussed BRCA, she will think about it and call back, we can refer her to geneticist   Osteoporosis: Discussed high calcium and vitamin D supplementation, weight bearing exercises  Cervical cancer screening: 08/11/18  Skin cancer: Discussed monitoring for atypical lesions - seeing Dermatologist she goes to [place]  Colorectal cancer: 05/29/11, she would like to switch to cologuard this time  Lung cancer:  Low Dose CT Chest recommended if Age 69-80 years, 20 pack-year currently smoking OR have quit w/in 15years. Patient does not qualify.   ECG: N/A  Advanced Care Planning: A voluntary discussion about advance care planning including the explanation and discussion of advance directives.  Discussed health care proxy and Living  will, and the patient was able to identify a health care proxy as husband   Patient does not have a living will at present time. Advised to bring a copy to [place]   Lipids: Lab Results  Component Value Date   CHOL 179 03/10/2018   Lab Results  Component Value Date   HDL 73 (A) 03/10/2018   Lab Results  Component Value Date   LDLCALC 89 03/10/2018   Lab Results  Component Value Date   TRIG 84 03/10/2018   No results found for: CHOLHDL No results found for: LDLDIRECT  Glucose: No results found for: GLUCOSE, GLUCAP  Patient Active Problem List    Diagnosis Date Noted  . Headache, migraine 07/16/2015    Past Surgical History:  Procedure Laterality Date  . CHOLECYSTECTOMY  1990  . COLONOSCOPY  05/28/2012   normal, repeat in 10 yrs  . FOOT SURGERY Left    twice  . TONSILLECTOMY      Family History  Adopted: Yes  Problem Relation Age of Onset  . Bladder Cancer Maternal Grandmother   . Stroke Maternal Grandmother   . Heart disease Maternal Grandmother   . Stroke Paternal Grandmother        health not good  . Heart disease Paternal Grandfather        mid 57's  . Brain cancer Paternal Aunt   . Suicidality Paternal Aunt   . Depression Maternal Uncle        Hospitalized for several months due to depression as an adult  . Leukemia Cousin   . Breast cancer Neg Hx     Social History   Socioeconomic History  . Marital status: Married    Spouse name: Ulice Dash   . Number of children: 3  . Years of education: Not on file  . Highest education level: Associate degree: academic program  Occupational History  . Occupation: Risk manager   Tobacco Use  . Smoking status: Never Smoker  . Smokeless tobacco: Never Used  . Tobacco comment: patient was a social smoker, very light use (1pk a month)  Vaping Use  . Vaping Use: Never used  Substance and Sexual Activity  . Alcohol use: Yes    Alcohol/week: 0.0 standard drinks    Comment: occasional  . Drug use: No  . Sexual activity: Yes    Partners: Male  Other Topics Concern  . Not on file  Social History Narrative   Married, adopted 3 daughters    She still has one daughter at home    Social Determinants of Health   Financial Resource Strain: Low Risk   . Difficulty of Paying Living Expenses: Not hard at all  Food Insecurity: No Food Insecurity  . Worried About Charity fundraiser in the Last Year: Never true  . Ran Out of Food in the Last Year: Never true  Transportation Needs: No Transportation Needs  . Lack of Transportation (Medical): No  . Lack of Transportation  (Non-Medical): No  Physical Activity: Sufficiently Active  . Days of Exercise per Week: 3 days  . Minutes of Exercise per Session: 60 min  Stress: No Stress Concern Present  . Feeling of Stress : Not at all  Social Connections: Moderately Integrated  . Frequency of Communication with Friends and Family: Once a week  . Frequency of Social Gatherings with Friends and Family: Once a week  . Attends Religious Services: More than 4 times per year  . Active Member of Clubs or Organizations: Yes  .  Attends Archivist Meetings: 1 to 4 times per year  . Marital Status: Married  Human resources officer Violence: Not At Risk  . Fear of Current or Ex-Partner: No  . Emotionally Abused: No  . Physically Abused: No  . Sexually Abused: No     Current Outpatient Medications:  .  CALCIUM CARBONATE-VIT D-MIN PO, Take by mouth., Disp: , Rfl:  .  diphenhydrAMINE (BENADRYL ALLERGY) 25 mg capsule, Take 1 capsule (25 mg total) by mouth every 6 (six) hours as needed., Disp: 30 capsule, Rfl: 0 .  Multiple Vitamins-Minerals (MULTIPLE VITAMINS/WOMENS PO), Take by mouth., Disp: , Rfl:  .  triazolam (HALCION) 0.25 MG tablet, TAKE 2 TABLETS BY MOUTH ONE HOUR PRIOR TO DENTAL APPOINTMENT, Disp: , Rfl:  .  zolmitriptan (ZOMIG) 5 MG tablet, TAKE 1 TABLET AS NEEDED FOR MIGRAINE, MAY REPEAT ONE TIME AFTER 2 HOURS, MAXIMUM 10 MG (2 TABLETS) IN 24 HOURS, Disp: 27 tablet, Rfl: 0  Allergies  Allergen Reactions  . Erythromycin      ROS  Constitutional: Negative for fever or weight change.  Respiratory: Negative for cough and shortness of breath.   Cardiovascular: Negative for chest pain or palpitations.  Gastrointestinal: Negative for abdominal pain, no bowel changes.  Musculoskeletal: Negative for gait problem or joint swelling.  Skin: Negative for rash.  Neurological: Negative for dizziness or headache.  No other specific complaints in a complete review of systems (except as listed in HPI  above).  Objective  Vitals:   11/10/20 1130  BP: 112/78  Pulse: 73  Resp: 16  Temp: 97.9 F (36.6 C)  TempSrc: Oral  SpO2: 99%  Weight: 136 lb 14.4 oz (62.1 kg)  Height: '5\' 8"'  (1.727 m)    Body mass index is 20.82 kg/m.  Physical Exam  Constitutional: Patient appears well-developed and well-nourished. No distress.  HENT: Head: Normocephalic and atraumatic. Ears: B TMs ok, no erythema or effusion; Nose: Not done . Mouth/Throat: not done  Eyes: Conjunctivae and EOM are normal. Pupils are equal, round, and reactive to light. No scleral icterus.  Neck: Normal range of motion. Neck supple. No JVD present. No thyromegaly present.  Cardiovascular: Normal rate, regular rhythm and normal heart sounds.  No murmur heard. No BLE edema. Pulmonary/Chest: Effort normal and breath sounds normal. No respiratory distress. Abdominal: Soft. Bowel sounds are normal, no distension. There is no tenderness. no masses Breast: no lumps or masses, no nipple discharge or rashes FEMALE GENITALIA:  Not done  RECTAL: not done  Musculoskeletal: Normal range of motion, no joint effusions. No gross deformities Neurological: he is alert and oriented to person, place, and time. No cranial nerve deficit. Coordination, balance, strength, speech and gait are normal.  Skin: Skin is warm and dry. No rash noted. No erythema.  Psychiatric: Patient has a normal mood and affect. behavior is normal. Judgment and thought content normal.  Fall Risk: Fall Risk  11/10/2020 11/09/2019 11/07/2018 08/11/2018 05/08/2018  Falls in the past year? 0 0 0 0 No  Number falls in past yr: 0 0 - - -  Injury with Fall? 0 0 - - -     Functional Status Survey: Is the patient deaf or have difficulty hearing?: No Does the patient have difficulty seeing, even when wearing glasses/contacts?: No Does the patient have difficulty concentrating, remembering, or making decisions?: No Does the patient have difficulty walking or climbing stairs?:  No Does the patient have difficulty dressing or bathing?: No Does the patient have difficulty doing errands  alone such as visiting a doctor's office or shopping?: No   Assessment & Plan  1. Well adult exam  - CBC with Differential/Platelet - COMPLETE METABOLIC PANEL WITH GFR - Hemoglobin A1c - Lipid panel  2. Migraine without aura and without status migrainosus, not intractable  Doing well on prn medication for migraine, she never tried Nurtec, taking triptan prn   3. Breast cancer screening by mammogram  - MM 3D SCREEN BREAST BILATERAL; Future  4. Colon cancer screening  - Cologuard  5. Long-term use of high-risk medication  - CBC with Differential/Platelet - COMPLETE METABOLIC PANEL WITH GFR  6. Diabetes mellitus screening  - Hemoglobin A1c  7. Lipid screening  - Lipid panel  8. Need for shingles vaccine  - Varicella-zoster vaccine IM  9. Needs flu shot  Refused   -USPSTF grade A and B recommendations reviewed with patient; age-appropriate recommendations, preventive care, screening tests, etc discussed and encouraged; healthy living encouraged; see AVS for patient education given to patient -Discussed importance of 150 minutes of physical activity weekly, eat two servings of fish weekly, eat one serving of tree nuts ( cashews, pistachios, pecans, almonds.Marland Kitchen) every other day, eat 6 servings of fruit/vegetables daily and drink plenty of water and avoid sweet beverages.

## 2020-11-10 ENCOUNTER — Encounter: Payer: Self-pay | Admitting: Family Medicine

## 2020-11-10 ENCOUNTER — Ambulatory Visit (INDEPENDENT_AMBULATORY_CARE_PROVIDER_SITE_OTHER): Payer: 59 | Admitting: Family Medicine

## 2020-11-10 ENCOUNTER — Other Ambulatory Visit: Payer: Self-pay

## 2020-11-10 VITALS — BP 112/78 | HR 73 | Temp 97.9°F | Resp 16 | Ht 68.0 in | Wt 136.9 lb

## 2020-11-10 DIAGNOSIS — Z79899 Other long term (current) drug therapy: Secondary | ICD-10-CM | POA: Diagnosis not present

## 2020-11-10 DIAGNOSIS — G43009 Migraine without aura, not intractable, without status migrainosus: Secondary | ICD-10-CM | POA: Diagnosis not present

## 2020-11-10 DIAGNOSIS — Z0000 Encounter for general adult medical examination without abnormal findings: Secondary | ICD-10-CM

## 2020-11-10 DIAGNOSIS — Z131 Encounter for screening for diabetes mellitus: Secondary | ICD-10-CM

## 2020-11-10 DIAGNOSIS — Z23 Encounter for immunization: Secondary | ICD-10-CM

## 2020-11-10 DIAGNOSIS — Z1231 Encounter for screening mammogram for malignant neoplasm of breast: Secondary | ICD-10-CM | POA: Diagnosis not present

## 2020-11-10 DIAGNOSIS — Z1211 Encounter for screening for malignant neoplasm of colon: Secondary | ICD-10-CM

## 2020-11-10 DIAGNOSIS — Z13220 Encounter for screening for lipoid disorders: Secondary | ICD-10-CM

## 2020-11-10 NOTE — Patient Instructions (Signed)
Preventive Care 58-58 Years Old, Female Preventive care refers to lifestyle choices and visits with your health care provider that can promote health and wellness. This includes:  A yearly physical exam. This is also called an annual wellness visit.  Regular dental and eye exams.  Immunizations.  Screening for certain conditions.  Healthy lifestyle choices, such as: ? Eating a healthy diet. ? Getting regular exercise. ? Not using drugs or products that contain nicotine and tobacco. ? Limiting alcohol use. What can I expect for my preventive care visit? Physical exam Your health care provider will check your:  Height and weight. These may be used to calculate your BMI (body mass index). BMI is a measurement that tells if you are at a healthy weight.  Heart rate and blood pressure.  Body temperature.  Skin for abnormal spots. Counseling Your health care provider may ask you questions about your:  Past medical problems.  Family's medical history.  Alcohol, tobacco, and drug use.  Emotional well-being.  Home life and relationship well-being.  Sexual activity.  Diet, exercise, and sleep habits.  Work and work Statistician.  Access to firearms.  Method of birth control.  Menstrual cycle.  Pregnancy history. What immunizations do I need? Vaccines are usually given at various ages, according to a schedule. Your health care provider will recommend vaccines for you based on your age, medical history, and lifestyle or other factors, such as travel or where you work.   What tests do I need? Blood tests  Lipid and cholesterol levels. These may be checked every 5 years, or more often if you are over 58 years old.  Hepatitis C test.  Hepatitis B test. Screening  Lung cancer screening. You may have this screening every year starting at age 58 if you have a 30-pack-year history of smoking and currently smoke or have quit within the past 15 years.  Colorectal cancer  screening. ? All adults should have this screening starting at age 58 and continuing until age 17. ? Your health care provider may recommend screening at age 58 if you are at increased risk. ? You will have tests every 1-10 years, depending on your results and the type of screening test.  Diabetes screening. ? This is done by checking your blood sugar (glucose) after you have not eaten for a while (fasting). ? You may have this done every 1-3 years.  Mammogram. ? This may be done every 1-2 years. ? Talk with your health care provider about when you should start having regular mammograms. This may depend on whether you have a family history of breast cancer.  BRCA-related cancer screening. This may be done if you have a family history of breast, ovarian, tubal, or peritoneal cancers.  Pelvic exam and Pap test. ? This may be done every 3 years starting at age 58. ? Starting at age 11, this may be done every 5 years if you have a Pap test in combination with an HPV test. Other tests  STD (sexually transmitted disease) testing, if you are at risk.  Bone density scan. This is done to screen for osteoporosis. You may have this scan if you are at high risk for osteoporosis. Talk with your health care provider about your test results, treatment options, and if necessary, the need for more tests. Follow these instructions at home: Eating and drinking  Eat a diet that includes fresh fruits and vegetables, whole grains, lean protein, and low-fat dairy products.  Take vitamin and mineral supplements  as recommended by your health care provider.  Do not drink alcohol if: ? Your health care provider tells you not to drink. ? You are pregnant, may be pregnant, or are planning to become pregnant.  If you drink alcohol: ? Limit how much you have to 0-1 drink a day. ? Be aware of how much alcohol is in your drink. In the [place] one drink equals one 12 oz bottle of beer (355 mL), one 5 oz glass of  wine (148 mL), or one 1 oz glass of hard liquor (44 mL).   Lifestyle  Take daily care of your teeth and gums. Brush your teeth every morning and night with fluoride toothpaste. Floss one time each day.  Stay active. Exercise for at least 30 minutes 5 or more days each week.  Do not use any products that contain nicotine or tobacco, such as cigarettes, e-cigarettes, and chewing tobacco. If you need help quitting, ask your health care provider.  Do not use drugs.  If you are sexually active, practice safe sex. Use a condom or other form of protection to prevent STIs (sexually transmitted infections).  If you do not wish to become pregnant, use a form of birth control. If you plan to become pregnant, see your health care provider for a prepregnancy visit.  If told by your health care provider, take low-dose aspirin daily starting at age 58.  Find healthy ways to cope with stress, such as: ? Meditation, yoga, or listening to music. ? Journaling. ? Talking to a trusted person. ? Spending time with friends and family. Safety  Always wear your seat belt while driving or riding in a vehicle.  Do not drive: ? If you have been drinking alcohol. Do not ride with someone who has been drinking. ? When you are tired or distracted. ? While texting.  Wear a helmet and other protective equipment during sports activities.  If you have firearms in your house, make sure you follow all gun safety procedures. What's next?  Visit your health care provider once a year for an annual wellness visit.  Ask your health care provider how often you should have your eyes and teeth checked.  Stay up to date on all vaccines. This information is not intended to replace advice given to you by your health care provider. Make sure you discuss any questions you have with your health care provider. Document Revised: 06/17/2020 Document Reviewed: 05/25/2018 Elsevier Patient Education  2021 Elsevier Inc.  

## 2020-11-14 LAB — LIPID PANEL
Cholesterol: 167 mg/dL (ref <200)
HDL: 83 mg/dL (ref >=50)
LDL Cholesterol (Calc): 70 mg/dL (calc)
Non-HDL Cholesterol (Calc): 84 mg/dL (calc) (ref <130)
Total CHOL/HDL Ratio: 2 (calc) (ref <5.0)
Triglycerides: 64 mg/dL (ref <150)

## 2020-11-14 LAB — COMPLETE METABOLIC PANEL WITH GFR
AG Ratio: 1.8 (calc) (ref 1.0–2.5)
ALT: 34 U/L — ABNORMAL HIGH (ref 6–29)
AST: 34 U/L (ref 10–35)
Albumin: 4.3 g/dL (ref 3.6–5.1)
Alkaline phosphatase (APISO): 71 U/L (ref 37–153)
BUN: 13 mg/dL (ref 7–25)
CO2: 29 mmol/L (ref 20–32)
Calcium: 9.1 mg/dL (ref 8.6–10.4)
Chloride: 107 mmol/L (ref 98–110)
Creat: 0.67 mg/dL (ref 0.50–1.05)
GFR, Est African American: 113 mL/min/{1.73_m2} (ref >=60)
GFR, Est Non African American: 98 mL/min/{1.73_m2} (ref >=60)
Globulin: 2.4 g/dL (calc) (ref 1.9–3.7)
Glucose, Bld: 99 mg/dL (ref 65–99)
Potassium: 4.1 mmol/L (ref 3.5–5.3)
Sodium: 142 mmol/L (ref 135–146)
Total Bilirubin: 1 mg/dL (ref 0.2–1.2)
Total Protein: 6.7 g/dL (ref 6.1–8.1)

## 2020-11-14 LAB — CBC WITH DIFFERENTIAL/PLATELET
Absolute Monocytes: 577 cells/uL (ref 200–950)
Basophils Absolute: 39 cells/uL (ref 0–200)
Basophils Relative: 1 %
Eosinophils Absolute: 203 cells/uL (ref 15–500)
Eosinophils Relative: 5.2 %
HCT: 37.1 % (ref 35.0–45.0)
Hemoglobin: 12.6 g/dL (ref 11.7–15.5)
Lymphs Abs: 1470 cells/uL (ref 850–3900)
MCH: 30.2 pg (ref 27.0–33.0)
MCHC: 34 g/dL (ref 32.0–36.0)
MCV: 89 fL (ref 80.0–100.0)
MPV: 10.1 fL (ref 7.5–12.5)
Monocytes Relative: 14.8 %
Neutro Abs: 1611 cells/uL (ref 1500–7800)
Neutrophils Relative %: 41.3 %
Platelets: 185 10*3/uL (ref 140–400)
RBC: 4.17 10*6/uL (ref 3.80–5.10)
RDW: 12.9 % (ref 11.0–15.0)
Total Lymphocyte: 37.7 %
WBC: 3.9 10*3/uL (ref 3.8–10.8)

## 2020-11-14 LAB — HEMOGLOBIN A1C
Hgb A1c MFr Bld: 5.4 % of total Hgb (ref <5.7)
Mean Plasma Glucose: 108 mg/dL
eAG (mmol/L): 6 mmol/L

## 2020-11-29 LAB — COLOGUARD: Cologuard: NEGATIVE

## 2020-12-29 ENCOUNTER — Ambulatory Visit
Admission: RE | Admit: 2020-12-29 | Discharge: 2020-12-29 | Disposition: A | Discharge status: Home / Self Care | Payer: 59 | Source: Ambulatory Visit | Attending: Family Medicine | Admitting: Family Medicine

## 2020-12-29 ENCOUNTER — Other Ambulatory Visit: Payer: Self-pay

## 2020-12-29 DIAGNOSIS — Z1231 Encounter for screening mammogram for malignant neoplasm of breast: Secondary | ICD-10-CM | POA: Diagnosis not present

## 2021-01-12 ENCOUNTER — Ambulatory Visit (INDEPENDENT_AMBULATORY_CARE_PROVIDER_SITE_OTHER): Payer: 59

## 2021-01-12 ENCOUNTER — Other Ambulatory Visit: Payer: Self-pay

## 2021-01-12 DIAGNOSIS — Z23 Encounter for immunization: Secondary | ICD-10-CM | POA: Diagnosis not present

## 2021-03-27 ENCOUNTER — Other Ambulatory Visit: Payer: Self-pay | Admitting: Family Medicine

## 2021-03-27 DIAGNOSIS — G43009 Migraine without aura, not intractable, without status migrainosus: Secondary | ICD-10-CM

## 2021-03-27 NOTE — Telephone Encounter (Signed)
Last seen 2.14.2022 upcoming appt 2.20.2023

## 2021-03-27 NOTE — Telephone Encounter (Signed)
  Notes to clinic:  script has expired  Review for continued use and refill    Requested Prescriptions  Pending Prescriptions Disp Refills   zolmitriptan (ZOMIG) 5 MG tablet [Pharmacy Med Name: ZOLMITRIPTAN TAB 5MG ] 27 tablet 0    Sig: TAKE 1 TABLET AS NEEDED FOR MIGRAINE, MAY REPEAT ONE TIME AFTER 2 HOURS, MAXIMUM 10 MG (2 TABLETS) IN 24 HOURS      Neurology:  Migraine Therapy - Triptan Passed - 03/27/2021  1:17 PM      Passed - Last BP in normal range    BP Readings from Last 1 Encounters:  11/10/20 112/78          Passed - Valid encounter within last 12 months    Recent Outpatient Visits           4 months ago Migraine without aura and without status migrainosus, not intractable   [facility] [place], Drue Stager, MD   1 year ago Migraine without aura and without status migrainosus, not intractable   [facility] [place], Drue Stager, MD   2 years ago Migraine without aura and without status migrainosus, not intractable   [facility] Steele Sizer, MD   2 years ago Well woman exam   [facility] [place], Drue Stager, MD   2 years ago Migraine without aura and without status migrainosus, not intractable   [facility] Steele Sizer, MD       Future Appointments             In 7 months Ancil Boozer, Drue Stager, MD [facility], [facility]

## 2021-06-16 ENCOUNTER — Other Ambulatory Visit: Payer: Self-pay | Admitting: Family Medicine

## 2021-06-16 DIAGNOSIS — G43009 Migraine without aura, not intractable, without status migrainosus: Secondary | ICD-10-CM

## 2021-09-07 ENCOUNTER — Other Ambulatory Visit: Payer: Self-pay | Admitting: Family Medicine

## 2021-09-07 DIAGNOSIS — G43009 Migraine without aura, not intractable, without status migrainosus: Secondary | ICD-10-CM

## 2021-11-16 ENCOUNTER — Encounter: Payer: 59 | Admitting: Family Medicine

## 2021-12-13 ENCOUNTER — Other Ambulatory Visit: Payer: Self-pay | Admitting: Family Medicine

## 2021-12-13 DIAGNOSIS — G43009 Migraine without aura, not intractable, without status migrainosus: Secondary | ICD-10-CM

## 2022-01-15 ENCOUNTER — Other Ambulatory Visit: Payer: Self-pay | Admitting: Family Medicine

## 2022-01-15 DIAGNOSIS — Z1231 Encounter for screening mammogram for malignant neoplasm of breast: Secondary | ICD-10-CM

## 2022-01-29 ENCOUNTER — Encounter: Payer: 59 | Admitting: Family Medicine

## 2022-02-19 ENCOUNTER — Ambulatory Visit
Admission: RE | Admit: 2022-02-19 | Discharge: 2022-02-19 | Disposition: A | Discharge status: Home / Self Care | Payer: 59 | Source: Ambulatory Visit | Attending: Family Medicine | Admitting: Family Medicine

## 2022-02-19 DIAGNOSIS — Z1231 Encounter for screening mammogram for malignant neoplasm of breast: Secondary | ICD-10-CM | POA: Diagnosis present

## 2022-03-13 ENCOUNTER — Other Ambulatory Visit: Payer: Self-pay | Admitting: Family Medicine

## 2022-03-13 DIAGNOSIS — G43009 Migraine without aura, not intractable, without status migrainosus: Secondary | ICD-10-CM

## 2022-05-17 ENCOUNTER — Encounter: Payer: 59 | Admitting: Family Medicine

## 2023-09-05 NOTE — Progress Notes (Unsigned)
Name: Lenice Schmuhl   MRN: [id number]    DOB: 1961-08-16   Date:09/05/2023       Progress Note  Subjective  Chief Complaint  No chief complaint on file.   HPI  Patient presents for annual CPE.  Diet: *** Exercise: ***  Last Eye Exam: *** Last Dental Exam: ***  Flowsheet Row Appointment from 09/06/2023 in [facility]  AUDIT-C Score 3       Depression: Phq 9 is  {Desc; negative/positive:13464}    11/10/2020   11:23 AM 11/09/2019   11:16 AM 11/07/2018    2:35 PM 08/11/2018   10:58 AM 05/08/2018    2:23 PM  Depression screen PHQ 2/9  Decreased Interest 0 0 0 0 0  Down, Depressed, Hopeless 0 0 0 0 0  PHQ - 2 Score 0 0 0 0 0  Altered sleeping 0 0  0   Tired, decreased energy 0 0  0   Change in appetite 0 0  0   Feeling bad or failure about yourself  0 0  0   Trouble concentrating 0 0  0   Moving slowly or fidgety/restless 0 0  0   Suicidal thoughts 0 0  0   PHQ-9 Score 0 0  0   Difficult doing work/chores    Not difficult at all    Hypertension: BP Readings from Last 3 Encounters:  11/10/20 112/78  11/09/19 94/70  11/07/18 108/64   Obesity: Wt Readings from Last 3 Encounters:  11/10/20 136 lb 14.4 oz (62.1 kg)  11/09/19 134 lb 14.4 oz (61.2 kg)  11/07/18 135 lb 4.8 oz (61.4 kg)   BMI Readings from Last 3 Encounters:  11/10/20 20.82 kg/m  11/09/19 20.51 kg/m  11/07/18 20.57 kg/m     Vaccines:   HPV:  Tdap:  Shingrix:  Pneumonia:  Flu:  COVID-19:   Hep C Screening:  STD testing and prevention (HIV/chl/gon/syphilis):  Intimate partner violence: {Desc; negative/positive:13464} screen  Sexual History : Menstrual History/LMP/Abnormal Bleeding:  Discussed importance of follow up if any post-menopausal bleeding: {Response; yes/no/na:60}  Incontinence Symptoms: {Desc; negative/positive:13464} for symptoms   Breast cancer:  - Last Mammogram: *** - BRCA gene screening:   Osteoporosis Prevention : Discussed high calcium and  vitamin D supplementation, weight bearing exercises Bone density :{Response; yes/no/na:60}   Cervical cancer screening:   Skin cancer: Discussed monitoring for atypical lesions  Colorectal cancer: ***   Lung cancer:  Low Dose CT Chest recommended if Age 30-80 years, 20 pack-year currently smoking OR have quit w/in 15years. Patient {DOES NOT does:27190::"does not"} qualify for screen   ECG: ***  Advanced Care Planning: A voluntary discussion about advance care planning including the explanation and discussion of advance directives.  Discussed health care proxy and Living will, and the patient was able to identify a health care proxy as ***.  Patient {DOES_DOES XBM:84132} have a living will and power of attorney of health care   Lipids: Lab Results  Component Value Date   CHOL 167 11/13/2020   CHOL 179 03/10/2018   Lab Results  Component Value Date   HDL 83 11/13/2020   HDL 73 (A) 03/10/2018   Lab Results  Component Value Date   LDLCALC 70 11/13/2020   LDLCALC 89 03/10/2018   Lab Results  Component Value Date   TRIG 64 11/13/2020   TRIG 84 03/10/2018   Lab Results  Component Value Date   CHOLHDL 2.0 11/13/2020   No results found for: "  LDLDIRECT"  Glucose: Glucose, Bld  Date Value Ref Range Status  11/13/2020 99 65 - 99 mg/dL Final    Comment:    .            Fasting reference interval .     Patient Active Problem List   Diagnosis Date Noted   Headache, migraine 07/16/2015    Past Surgical History:  Procedure Laterality Date   CHOLECYSTECTOMY  1990   COLONOSCOPY  05/28/2012   normal, repeat in 10 yrs   FOOT SURGERY Left    twice   TONSILLECTOMY      Family History  Adopted: Yes  Problem Relation Age of Onset   Bladder Cancer Maternal Grandmother    Stroke Maternal Grandmother    Heart disease Maternal Grandmother    Stroke Paternal Grandmother        health not good   Heart disease Paternal Grandfather        mid 86's   Brain cancer Paternal Aunt     Suicidality Paternal Aunt    Depression Maternal Uncle        Hospitalized for several months due to depression as an adult   Leukemia Cousin    Breast cancer Neg Hx     Social History   Socioeconomic History   Marital status: Married    Spouse name: Vonna Kotyk    Number of children: 3   Years of education: Not on file   Highest education level: Associate degree: occupational, Scientist, product/process development, or vocational program  Occupational History   Occupation: Marketing executive   Tobacco Use   Smoking status: Never   Smokeless tobacco: Never   Tobacco comments:    patient was a social smoker, very light use (1pk a month)  Vaping Use   Vaping status: Never Used  Substance and Sexual Activity   Alcohol use: Yes    Alcohol/week: 0.0 standard drinks of alcohol    Comment: occasional   Drug use: No   Sexual activity: Yes    Partners: Male  Other Topics Concern   Not on file  Social History Narrative   Married, adopted 3 daughters    She still has one daughter at home    Social Determinants of Health   Financial Resource Strain: Low Risk  (09/05/2023)   Overall Financial Resource Strain (CARDIA)    Difficulty of Paying Living Expenses: Not hard at all  Food Insecurity: No Food Insecurity (09/05/2023)   Hunger Vital Sign    Worried About Running Out of Food in the Last Year: Never true    Ran Out of Food in the Last Year: Never true  Transportation Needs: No Transportation Needs (09/05/2023)   PRAPARE - Administrator, Civil Service (Medical): No    Lack of Transportation (Non-Medical): No  Physical Activity: Sufficiently Active (09/05/2023)   Exercise Vital Sign    Days of Exercise per Week: 3 days    Minutes of Exercise per Session: 60 min  Stress: No Stress Concern Present (09/05/2023)   [employer] of Occupational Health - Occupational Stress Questionnaire    Feeling of Stress : Not at all  Social Connections: Moderately Integrated (09/05/2023)   Social Connection and  Isolation Panel [NHANES]    Frequency of Communication with Friends and Family: Once a week    Frequency of Social Gatherings with Friends and Family: Once a week    Attends Religious Services: More than 4 times per year    Active Member of [employer]  or Organizations: Yes    Attends Banker Meetings: 1 to 4 times per year    Marital Status: Married  Catering manager Violence: Not At Risk (11/10/2020)   Humiliation, Afraid, Rape, and Kick questionnaire    Fear of Current or Ex-Partner: No    Emotionally Abused: No    Physically Abused: No    Sexually Abused: No     Current Outpatient Medications:    CALCIUM CARBONATE-VIT D-MIN PO, Take by mouth., Disp: , Rfl:    diphenhydrAMINE (BENADRYL ALLERGY) 25 mg capsule, Take 1 capsule (25 mg total) by mouth every 6 (six) hours as needed., Disp: 30 capsule, Rfl: 0   Multiple Vitamins-Minerals (MULTIPLE VITAMINS/WOMENS PO), Take by mouth., Disp: , Rfl:    triazolam (HALCION) 0.25 MG tablet, TAKE 2 TABLETS BY MOUTH ONE HOUR PRIOR TO DENTAL APPOINTMENT, Disp: , Rfl:    zolmitriptan (ZOMIG) 5 MG tablet, TAKE 1 TABLET AS NEEDED FOR MIGRAINE, MAY REPEAT ONE TIME AFTER 2 HOURS, MAXIMUM OF 10 MG (2 TABLETS) IN 24 HOURS, Disp: 27 tablet, Rfl: 0  Allergies  Allergen Reactions   Erythromycin      ROS  ***  Objective  There were no vitals filed for this visit.  There is no height or weight on file to calculate BMI.  Physical Exam ***  No results found for this or any previous visit (from the past 2160 hour(s)).   Fall Risk:    11/10/2020   11:23 AM 11/09/2019   11:15 AM 11/07/2018    2:35 PM 08/11/2018   10:57 AM 05/08/2018    2:23 PM  Fall Risk   Falls in the past year? 0 0 0 0 No  Number falls in past yr: 0 0     Injury with Fall? 0 0      ***  Functional Status Survey:   ***  Assessment & Plan  There are no diagnoses linked to this encounter.  -USPSTF grade A and B recommendations reviewed with patient;  age-appropriate recommendations, preventive care, screening tests, etc discussed and encouraged; healthy living encouraged; see AVS for patient education given to patient -Discussed importance of 150 minutes of physical activity weekly, eat two servings of fish weekly, eat one serving of tree nuts ( cashews, pistachios, pecans, almonds.Marland Kitchen) every other day, eat 6 servings of fruit/vegetables daily and drink plenty of water and avoid sweet beverages.   -Reviewed Health Maintenance: {yes ZO:109604}

## 2023-09-06 ENCOUNTER — Other Ambulatory Visit: Payer: Self-pay

## 2023-09-06 ENCOUNTER — Encounter: Payer: Self-pay | Admitting: Internal Medicine

## 2023-09-06 ENCOUNTER — Ambulatory Visit: Payer: Self-pay | Admitting: Internal Medicine

## 2023-09-06 VITALS — BP 122/84 | HR 84 | Temp 98.1°F | Resp 18 | Ht 68.0 in | Wt 138.4 lb

## 2023-09-06 DIAGNOSIS — Z13220 Encounter for screening for lipoid disorders: Secondary | ICD-10-CM

## 2023-09-06 DIAGNOSIS — Z0000 Encounter for general adult medical examination without abnormal findings: Secondary | ICD-10-CM

## 2023-09-07 LAB — CBC WITH DIFFERENTIAL/PLATELET
Absolute Lymphocytes: 2077 {cells}/uL (ref 850–3900)
Absolute Monocytes: 620 {cells}/uL (ref 200–950)
Basophils Absolute: 37 {cells}/uL (ref 0–200)
Basophils Relative: 0.6 %
Eosinophils Absolute: 192 {cells}/uL (ref 15–500)
Eosinophils Relative: 3.1 %
HCT: 39 % (ref 35.0–45.0)
Hemoglobin: 13 g/dL (ref 11.7–15.5)
MCH: 30.1 pg (ref 27.0–33.0)
MCHC: 33.3 g/dL (ref 32.0–36.0)
MCV: 90.3 fL (ref 80.0–100.0)
MPV: 10.4 fL (ref 7.5–12.5)
Monocytes Relative: 10 %
Neutro Abs: 3274 {cells}/uL (ref 1500–7800)
Neutrophils Relative %: 52.8 %
Platelets: 230 10*3/uL (ref 140–400)
RBC: 4.32 10*6/uL (ref 3.80–5.10)
RDW: 11.8 % (ref 11.0–15.0)
Total Lymphocyte: 33.5 %
WBC: 6.2 10*3/uL (ref 3.8–10.8)

## 2023-09-07 LAB — COMPLETE METABOLIC PANEL WITH GFR
AG Ratio: 1.8 (calc) (ref 1.0–2.5)
ALT: 20 U/L (ref 6–29)
AST: 21 U/L (ref 10–35)
Albumin: 4.5 g/dL (ref 3.6–5.1)
Alkaline phosphatase (APISO): 69 U/L (ref 37–153)
BUN: 14 mg/dL (ref 7–25)
CO2: 30 mmol/L (ref 20–32)
Calcium: 9.6 mg/dL (ref 8.6–10.4)
Chloride: 105 mmol/L (ref 98–110)
Creat: 0.67 mg/dL (ref 0.50–1.05)
Globulin: 2.5 g/dL (ref 1.9–3.7)
Glucose, Bld: 87 mg/dL (ref 65–99)
Potassium: 4.5 mmol/L (ref 3.5–5.3)
Sodium: 141 mmol/L (ref 135–146)
Total Bilirubin: 1.2 mg/dL (ref 0.2–1.2)
Total Protein: 7 g/dL (ref 6.1–8.1)
eGFR: 100 mL/min/{1.73_m2} (ref >=60)

## 2023-09-07 LAB — LIPID PANEL
Cholesterol: 201 mg/dL — ABNORMAL HIGH (ref <200)
HDL: 80 mg/dL (ref >=50)
LDL Cholesterol (Calc): 103 mg/dL — ABNORMAL HIGH
Non-HDL Cholesterol (Calc): 121 mg/dL (ref <130)
Total CHOL/HDL Ratio: 2.5 (calc) (ref <5.0)
Triglycerides: 87 mg/dL (ref <150)

## 2023-10-06 ENCOUNTER — Other Ambulatory Visit: Payer: Self-pay

## 2023-10-06 DIAGNOSIS — Z1231 Encounter for screening mammogram for malignant neoplasm of breast: Secondary | ICD-10-CM

## 2023-11-07 ENCOUNTER — Ambulatory Visit: Payer: Self-pay

## 2023-12-26 ENCOUNTER — Ambulatory Visit: Payer: Self-pay | Attending: Hematology and Oncology | Admitting: Hematology and Oncology

## 2023-12-26 ENCOUNTER — Other Ambulatory Visit: Payer: Self-pay

## 2023-12-26 VITALS — BP 109/72 | Wt 138.4 lb

## 2023-12-26 DIAGNOSIS — Z124 Encounter for screening for malignant neoplasm of cervix: Secondary | ICD-10-CM

## 2023-12-26 NOTE — Progress Notes (Signed)
 Diana Stevens is a 63 y.o. G0P0000 female who presents to [facility] clinic today with no complaints.    Pap Smear: Pap smear completed today. Last Pap smear was 08/11/2018 at [facility] and was normal. Per patient has no history of an abnormal Pap smear. Last Pap smear result is available in Epic.   Physical exam: Breasts Breasts symmetrical. No skin abnormalities bilateral breasts. No nipple retraction bilateral breasts. No nipple discharge bilateral breasts. No lymphadenopathy. No lumps palpated bilateral breasts.       Pelvic/Bimanual Ext Genitalia No lesions, no swelling and no discharge observed on external genitalia.        Vagina Vagina pink and normal texture. No lesions or discharge observed in vagina.        Cervix Cervix is present. Cervix pink and of normal texture. No discharge observed.    Uterus Uterus is present and palpable. Uterus in normal position and normal size.        Adnexae Bilateral ovaries present and palpable. No tenderness on palpation.         Rectovaginal No rectal exam completed today since patient had no rectal complaints. No skin abnormalities observed on exam.     Smoking History: Patient has never smoked and was not referred to quit line.    Patient Navigation: Patient education provided. Access to services provided for patient through [facility] program. No interpreter provided. No transportation provided   Colorectal Cancer Screening: Per patient has had colonoscopy completed on 05/29/2011 - negative - 11/24/2020 - Cologuard - negative.  No complaints today. States she will most likely continue with Cologuard and was instructed that is due this year. She was also informed of our FIT testing if she wishes to do that.    Breast and Cervical Cancer Risk Assessment: Patient does not have family history of breast cancer, known genetic mutations, or radiation treatment to the chest before age 70. Patient does not have history of cervical  dysplasia, immunocompromised, or DES exposure in-utero.  Risk Scores as of Encounter on 12/26/2023     Diana Stevens           5-year 1.6%   Lifetime 8.12%            Last calculated by Diana Rutherford, LPN on 2/95/6213 at  1:43 PM        A: [facility] exam with pap smear No complaints with benign exam. Patient wishes to have ultrasound imaging with [facility] rather than mammogram as these are uncomfortable for her. She was instructed that our services are free of charge as there is a charge for [employer]. She verbalizes understanding and will contact us if she wants our services.   P: She will have breast imaging with [facility] and will utilize our services if needed.   Pascal Lux, NP 12/26/2023 1:59 PM

## 2023-12-26 NOTE — Patient Instructions (Signed)
 Taught Diana Stevens about self breast awareness and gave educational materials to take home. Patient did need a Pap smear today due to last Pap smear was in 08/11/2018 per patient. Told patient about free cervical cancer screenings to receive a Pap smear if would like one next year. Let her know [facility] will cover Pap smears every 5 years unless has a history of abnormal Pap smears.    Diana Lux, NP 2:19 PM

## 2023-12-29 LAB — CYTOLOGY - PAP
Comment: NEGATIVE
Diagnosis: NEGATIVE
High risk HPV: NEGATIVE

## 2024-05-29 ENCOUNTER — Ambulatory Visit: Payer: Self-pay | Admitting: Podiatry

## 2024-09-07 ENCOUNTER — Encounter: Payer: Self-pay | Admitting: Family Medicine

## 2024-09-27 ENCOUNTER — Other Ambulatory Visit: Payer: Self-pay | Admitting: Medical Genetics
